# Patient Record
Sex: Male | Born: 2000 | Race: White | Hispanic: No | Marital: Single | Smoking: Former smoker
Health system: Southern US, Community
[De-identification: ages and names within clinical notes are randomized; demographics above are authoritative.]

## PROBLEM LIST (undated history)

## (undated) DIAGNOSIS — J302 Other seasonal allergic rhinitis: Secondary | ICD-10-CM

## (undated) DIAGNOSIS — J45909 Unspecified asthma, uncomplicated: Secondary | ICD-10-CM

## (undated) DIAGNOSIS — K635 Polyp of colon: Secondary | ICD-10-CM

## (undated) DIAGNOSIS — F909 Attention-deficit hyperactivity disorder, unspecified type: Secondary | ICD-10-CM

## (undated) DIAGNOSIS — F419 Anxiety disorder, unspecified: Secondary | ICD-10-CM

## (undated) HISTORY — DX: Polyp of colon: K63.5

## (undated) HISTORY — PX: COLONOSCOPY: SHX174

---
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
## 2018-05-26 DIAGNOSIS — J301 Allergic rhinitis due to pollen: Secondary | ICD-10-CM | POA: Diagnosis not present

## 2018-05-26 DIAGNOSIS — J3081 Allergic rhinitis due to animal (cat) (dog) hair and dander: Secondary | ICD-10-CM | POA: Diagnosis not present

## 2018-05-26 DIAGNOSIS — J3089 Other allergic rhinitis: Secondary | ICD-10-CM | POA: Diagnosis not present

## 2018-05-26 DIAGNOSIS — J4540 Moderate persistent asthma, uncomplicated: Secondary | ICD-10-CM | POA: Diagnosis not present

## 2018-06-10 ENCOUNTER — Telehealth: Payer: Self-pay

## 2018-06-10 ENCOUNTER — Other Ambulatory Visit: Payer: Self-pay

## 2018-06-10 DIAGNOSIS — R251 Tremor, unspecified: Secondary | ICD-10-CM

## 2018-06-10 NOTE — Telephone Encounter (Signed)
Copied from [place] [phone]. Topic: Referral - Request for Referral >> Jun 09, 2018  1:16 PM Marin Olp L wrote: Has patient seen PCP for this complaint? No. (unsure) *If NO, is insurance requiring patient see PCP for this issue before PCP can refer them? yes Referral for which specialty: Neurology Preferred provider/office: whoever Dr. Jerline Pain recommends Reason for referral: tremors (runs in the family, allergist suggested referral to neuro)

## 2018-06-10 NOTE — Telephone Encounter (Signed)
[facility] with referral.  Algis Greenhouse. Jerline Pain, MD 06/10/2018 11:19 AM

## 2018-06-10 NOTE — Telephone Encounter (Signed)
Please advise.  Do you want patient to come in for OV first?

## 2018-06-10 NOTE — Telephone Encounter (Signed)
Referral has been placed. 

## 2018-06-12 ENCOUNTER — Encounter: Payer: Self-pay | Admitting: Neurology

## 2018-06-23 NOTE — Progress Notes (Addendum)
Subjective:   Kevin Zimmerman was seen in consultation in the movement disorder clinic at the request of Vivi Barrack, MD.  The patient is accompanied by his mother who supplements the history.  The records that were made available to me were reviewed but no notes are seen regarding tremor. The evaluation is for tremor.  Tremor is located in the arms, legs, and head.  He is right-hand dominant, but tremor seems to be the same in both hands.  His maternal grandfather had tremor and several maternal cousins.    Affected by caffeine:  Yes.   Affected by alcohol: Does not drink enough to know. Affected by stress:  Yes.   Spills soup if on spoon:  Yes.   Spills glass of liquid if full:  Yes.   Affects ADL's (tying shoes, brushing teeth, etc):  No.  Current/Previously tried tremor medications: None  Current medications that may exacerbate tremor:  albuterol (uses this 1 time per week), concerta (currently, does not use this.  Only uses this when at school and is currently taking a gap year), advair (uses this daily)  Outside reports reviewed: historical medical records, lab reports and referral letter/letters.  Allergies  Allergen Reactions  . Food Anaphylaxis    Tested positive to peanuts years ago but tolerates peanuts well at present. 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  . 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 Penicillins Anaphylaxis    Outpatient Encounter Medications as of 06/25/2018  Medication Sig  . fluticasone-salmeterol (ADVAIR HFA) 115-21 MCG/ACT inhaler Inhale 2 puffs into the lungs every morning.  Marland Kitchen ibuprofen (ADVIL,MOTRIN) 200 MG tablet Take 200 mg by mouth every 6 (six) hours as needed for pain. Reported on 08/29/2015  . albuterol (PROVENTIL HFA;VENTOLIN HFA) 108 (90 Base) MCG/ACT inhaler Inhale 2 puffs into the lungs every 6 (six) hours as needed for wheezing.  (Patient not taking: Reported on 06/25/2018)  . EPINEPHrine 0.3 mg/0.3 mL IJ SOAJ injection Inject 0.3 mLs (0.3 mg total) into the muscle once. (Patient not taking: Reported on 06/25/2018)  . [DISCONTINUED] methylphenidate (CONCERTA) 36 MG PO CR tablet 2 caps every morning   No facility-administered encounter medications on file as of 06/25/2018.     Past Medical History:  Diagnosis Date  . ADHD (attention deficit hyperactivity disorder)   . Asthma   . Colon polyps   . Seasonal allergies     Past Surgical History:  Procedure Laterality Date  . COLONOSCOPY    . COLONOSCOPY N/A 10/09/2012   Procedure: COLONOSCOPY;  Surgeon: Oletha Blend, MD;  Location: [place];  Service: Gastroenterology;  Laterality: N/A;    Social History   Socioeconomic History  . Marital status: Single    Spouse name: Not on file  . Number of children: Not on file  . Years of education: Not on file  . Highest education level: Not on file  Occupational History  . Not on file  Social Needs  . Financial resource strain: Not on file  . Food insecurity:    Worry: Not on file    Inability: Not on file  . Transportation needs:    Medical: Not on file    Non-medical: Not on file  Tobacco Use  . Smoking status: Former Smoker    Packs/day: 0.50    Types: Cigarettes    Last attempt to quit: 03/21/2017    Years since quitting: 1.2  .  Smokeless tobacco: Never Used  Substance and Sexual Activity  . Alcohol use: Yes    Alcohol/week: 1.0 standard drinks    Types: 1 Cans of beer per week  . Drug use: No  . Sexual activity: Yes    Partners: Female    Birth control/protection: Condom    Comment: One partner. Uncertain of additional contraception besides condoms.  Lifestyle  . Physical activity:    Days per week: Not on file    Minutes per session: Not on file  . Stress: Not on file  Relationships  . Social connections:    Talks on phone: Not on file    Gets together: Not on file    Attends religious  service: Not on file    Active member of club or organization: Not on file    Attends meetings of clubs or organizations: Not on file    Relationship status: Not on file  . Intimate partner violence:    Fear of current or ex partner: Not on file    Emotionally abused: Not on file    Physically abused: Not on file    Forced sexual activity: Not on file  Other Topics Concern  . Not on file  Social History Narrative   Senior in high school    Family Status  Relation Name Status  . Sister 92 yo Comptroller  . Brother 33 yo Comptroller  . PGF  (Not Specified)  . Mat Uncle  (Not Specified)  . Annamarie Major  (Not Specified)  . MGM  (Not Specified)  . MGF  (Not Specified)  . Mother  Alive  . Father  Alive    Review of Systems Review of Systems  Constitutional: Negative.   HENT: Negative.   Eyes: Negative.   Respiratory: Positive for shortness of breath (With asthma).   Cardiovascular: Negative.   Gastrointestinal: Negative.   Genitourinary: Negative.   Musculoskeletal: Negative.   Skin: Negative.   Endo/Heme/Allergies: Negative.      Objective:   VITALS:   Vitals:   06/25/18 0908  BP: 118/76  Pulse: 68  SpO2: 98%  Weight: 159 lb (72.1 kg)  Height: 5\' 7"  (1.702 m)   Gen:  Appears stated age and in NAD. HEENT:  Normocephalic, atraumatic. The mucous membranes are moist. The superficial temporal arteries are without ropiness or tenderness. Cardiovascular: Regular rate and rhythm. Lungs: Clear to auscultation bilaterally. Neck: There are no carotid bruits noted bilaterally.  NEUROLOGICAL:  Orientation:  The patient is alert and oriented x 3.  Recent and remote memory are intact.  Attention span and concentration are normal.  Able to name objects and repeat without trouble.  Fund of knowledge is appropriate Cranial nerves: There is good facial symmetry. The pupils are equal round and reactive to light bilaterally. Fundoscopic exam reveals clear disc margins bilaterally. Extraocular  muscles are intact and visual fields are full to confrontational testing. Speech is fluent and clear. Doesn't allow for examination of the oropharynx/soft palate (wearing a mask and does not want to take it off).  Hearing is intact to conversational tone. Tone: Tone is good throughout. Sensation: Sensation is intact to light touch and pinprick throughout (facial, trunk, extremities). Vibration is intact at the bilateral big toe. There is no extinction with double simultaneous stimulation. There is no sensory dermatomal level identified. Coordination:  The patient has no dysdiadichokinesia or dysmetria. Motor: Strength is 5/5 in the bilateral upper and lower extremities.  Shoulder shrug is equal bilaterally.  There is  no pronator drift.  There are no fasciculations noted. DTR's: Deep tendon reflexes are 2/4 at the bilateral biceps, triceps, brachioradialis, patella and achilles.  Plantar responses are downgoing bilaterally. Gait and Station: The patient is able to ambulate without difficulty. The patient is able to heel toe walk without any difficulty. The patient is able to ambulate in a tandem fashion. The patient is able to stand in the Romberg position.   MOVEMENT EXAM: Tremor:  There is no rest tremor.  There is no leg tremor.  There is no head tremor.  He has very mild tremor of the outstretched hands.  There is almost no tremor when he is given a weight.  He has almost no trouble with Archimedes spirals.  He has some tremor when pouring water from one glass to another, but does not spill much of the water.    When asked to tap out a rhythm with one hand, the frequency of the tremor in the other hand matches the rhythm asked to be tapped out.   Lab Results  Component Value Date   TSH 1.07 03/18/2017     Chemistry      Component Value Date/Time   NA 137 03/19/2017 0235   K 3.6 03/19/2017 0235   CL 102 03/19/2017 0235   CO2 24 03/19/2017 0235   BUN 9 03/19/2017 0235   CREATININE 0.87  03/19/2017 0235      Component Value Date/Time   CALCIUM 9.6 03/19/2017 0235   ALKPHOS 102 03/18/2017 1550   AST 23 03/18/2017 1550   ALT 30 03/18/2017 1550   BILITOT 0.5 03/18/2017 1550             Assessment/Plan:   1.  Tremor.  -This is likely essential tremor, aggravated by medications for asthma.  He does have somewhat of a nonphysiologic component at times.   We discussed nature and pathophysiology.  We discussed that this can continue to gradually get worse with time.  We discussed that some medications can worsen this, as can caffeine use.  We discussed medication therapy as well nonpharmacologic methods of treatment (biofeedback).  The patient does not want medication.  The patient asked about CBD.  Explained that there are no good controlled studies on marijuana or CBD for tremor and the American Academy of neurology does not recommend this.    -will do tsh and chem  -His neurologic exam is nonfocal and nonlateralizing today.  -We will call him with his lab work.  -Patient will let me know if he changes his mind regarding medication, or if he develops new or worsening signs or symptoms.  Otherwise, he will follow-up with me on an as-needed basis.  2.  Tobacco abuse  -discussed importance of cessation for overall health/wellness, especially given asthma  CC:  Vivi Barrack, MD

## 2018-06-25 ENCOUNTER — Other Ambulatory Visit (INDEPENDENT_AMBULATORY_CARE_PROVIDER_SITE_OTHER): Payer: BLUE CROSS/BLUE SHIELD

## 2018-06-25 ENCOUNTER — Encounter: Payer: Self-pay | Admitting: Neurology

## 2018-06-25 ENCOUNTER — Other Ambulatory Visit: Payer: Self-pay

## 2018-06-25 ENCOUNTER — Ambulatory Visit (INDEPENDENT_AMBULATORY_CARE_PROVIDER_SITE_OTHER): Payer: BLUE CROSS/BLUE SHIELD | Admitting: Neurology

## 2018-06-25 VITALS — BP 118/76 | HR 68 | Ht 67.0 in | Wt 159.0 lb

## 2018-06-25 DIAGNOSIS — R251 Tremor, unspecified: Secondary | ICD-10-CM

## 2018-06-25 DIAGNOSIS — J45909 Unspecified asthma, uncomplicated: Secondary | ICD-10-CM | POA: Diagnosis not present

## 2018-06-25 DIAGNOSIS — Z720 Tobacco use: Secondary | ICD-10-CM | POA: Diagnosis not present

## 2018-06-26 ENCOUNTER — Telehealth: Payer: Self-pay

## 2018-06-26 ENCOUNTER — Ambulatory Visit: Payer: BLUE CROSS/BLUE SHIELD | Admitting: Internal Medicine

## 2018-06-26 ENCOUNTER — Telehealth: Payer: Self-pay | Admitting: *Deleted

## 2018-06-26 ENCOUNTER — Encounter: Payer: Self-pay | Admitting: Internal Medicine

## 2018-06-26 VITALS — BP 102/70 | HR 87 | Temp 98.5°F | Wt 158.4 lb

## 2018-06-26 DIAGNOSIS — B9789 Other viral agents as the cause of diseases classified elsewhere: Secondary | ICD-10-CM | POA: Diagnosis not present

## 2018-06-26 DIAGNOSIS — J069 Acute upper respiratory infection, unspecified: Secondary | ICD-10-CM

## 2018-06-26 DIAGNOSIS — J4520 Mild intermittent asthma, uncomplicated: Secondary | ICD-10-CM

## 2018-06-26 LAB — COMPREHENSIVE METABOLIC PANEL
AG Ratio: 2.1 (calc) (ref 1.0–2.5)
ALBUMIN MSPROF: 5.2 g/dL — AB (ref 3.6–5.1)
ALT: 25 U/L (ref 8–46)
AST: 18 U/L (ref 12–32)
Alkaline phosphatase (APISO): 92 U/L (ref 46–169)
BILIRUBIN TOTAL: 0.6 mg/dL (ref 0.2–1.1)
BUN: 15 mg/dL (ref 7–20)
CALCIUM: 10 mg/dL (ref 8.9–10.4)
CO2: 27 mmol/L (ref 20–32)
Chloride: 104 mmol/L (ref 98–110)
Creat: 0.86 mg/dL (ref 0.60–1.26)
GLOBULIN: 2.5 g/dL (ref 2.1–3.5)
Glucose, Bld: 87 mg/dL (ref 65–99)
POTASSIUM: 4.9 mmol/L (ref 3.8–5.1)
SODIUM: 140 mmol/L (ref 135–146)
TOTAL PROTEIN: 7.7 g/dL (ref 6.3–8.2)

## 2018-06-26 LAB — TSH: TSH: 0.71 m[IU]/L (ref 0.50–4.30)

## 2018-06-26 NOTE — Patient Instructions (Signed)
-I hope you feel better soon!  -May use mucinex twice a day, delsym twice a day as needed for cough and over the counter flu/sinues medication as needed for pain and congestion.  -Come back to see [place] if no improvement in 10-14 days.   Viral Respiratory Infection A viral respiratory infection is an illness that affects parts of the body that are used for breathing. These include the lungs, nose, and throat. It is caused by a germ called a virus. Some examples of this kind of infection are:  A cold.  The flu (influenza).  A respiratory syncytial virus (RSV) infection. A person who gets this illness may have the following symptoms:  A stuffy or runny nose.  Yellow or green fluid in the nose.  A cough.  Sneezing.  Tiredness (fatigue).  Achy muscles.  A sore throat.  Sweating or chills.  A fever.  A headache. Follow these instructions at home: Managing pain and congestion  Take over-the-counter and prescription medicines only as told by your doctor.  If you have a sore throat, gargle with salt water. Do this 3-4 times per day or as needed. To make a salt-water mixture, dissolve -1 tsp of salt in 1 cup of warm water. Make sure that all the salt dissolves.  Use nose drops made from salt water. This helps with stuffiness (congestion). It also helps soften the skin around your nose.  Drink enough fluid to keep your pee (urine) pale yellow. General instructions   Rest as much as possible.  Do not drink alcohol.  Do not use any products that have nicotine or tobacco, such as cigarettes and e-cigarettes. If you need help quitting, ask your doctor.  Keep all follow-up visits as told by your doctor. This is important. How is this prevented?   Get a flu shot every year. Ask your doctor when you should get your flu shot.  Do not let other people get your germs. If you are sick: ? Stay home from work or school. ? Wash your hands with soap and water often. Wash your  hands after you cough or sneeze. If soap and water are not available, use hand sanitizer.  Avoid contact with people who are sick during cold and flu season. This is in fall and winter. Get help if:  Your symptoms last for 10 days or longer.  Your symptoms get worse over time.  You have a fever.  You have very bad pain in your face or forehead.  Parts of your jaw or neck become very swollen. Get help right away if:  You feel pain or pressure in your chest.  You have shortness of breath.  You faint or feel like you will faint.  You keep throwing up (vomiting).  You feel confused. Summary  A viral respiratory infection is an illness that affects parts of the body that are used for breathing.  Examples of this illness include a cold, the flu, and respiratory syncytial virus (RSV) infection.  The infection can cause a runny nose, cough, sneezing, sore throat, and fever.  Follow what your doctor tells you about taking medicines, drinking lots of fluid, washing your hands, resting at home, and avoiding people who are sick. This information is not intended to replace advice given to you by your health care provider. Make sure you discuss any questions you have with your health care provider. Document Released: 03/14/2008 Document Revised: 05/12/2017 Document Reviewed: 05/12/2017 Elsevier Interactive Patient Education  2019 [employer].

## 2018-06-26 NOTE — Telephone Encounter (Signed)
I called the pt to ask prescreening questions due to the Coronavirus.  Patient's mother stated the pt has not had a fever, he has not traveled to areas affected; however his father was in [place] last week-returned 2 days ago and denies any symptoms.  She also stated he has not had any contact with anyone he is aware of that has the virus and he does have a history of pneumonia she feels each time he gets sick.  Message sent to Dr Jerilee Hoh as the pt has an appt today at 11:30am.

## 2018-06-26 NOTE — Telephone Encounter (Signed)
-----   Message from [place], DO sent at 06/26/2018  7:31 AM EDT ----- I have reviewed all lab results which are normal or stable. Please inform the patient.

## 2018-06-26 NOTE — Progress Notes (Signed)
Established Patient Office Visit     CC/Reason for Visit: Cough, wheezing  HPI: Kevin Zimmerman is a 18 y.o. male who is coming in today for the above mentioned reasons. Past Medical History is significant for: Asthma.  He has had a cough productive of minimal amounts of yellow sputum, nasal drainage x3 days, no fever chills, he has been wheezing, states that he is prone to getting pneumonia and wants to get checked out before this happens.  He has had no recent travels or sick contact.  He has not been using his albuterol inhaler more frequently than usual, states his average uses this once to twice a week.  He continues to use his Advair as prescribed.   Past Medical/Surgical History: Past Medical History:  Diagnosis Date  . ADHD (attention deficit hyperactivity disorder)   . Asthma   . Colon polyps   . Seasonal allergies     Past Surgical History:  Procedure Laterality Date  . COLONOSCOPY    . COLONOSCOPY N/A 10/09/2012   Procedure: COLONOSCOPY;  Surgeon: Oletha Blend, MD;  Location: [facility];  Service: Gastroenterology;  Laterality: N/A;    Social History:  reports that he has been smoking cigarettes. He has never used smokeless tobacco. He reports current alcohol use of about 1.0 standard drinks of alcohol per week. He reports current drug use. Drug: Marijuana.  Allergies: Allergies  Allergen Reactions  . Food Anaphylaxis    Tested positive to peanuts years ago but tolerates peanuts well at present. 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  . 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 Penicillins Anaphylaxis    Family History:  Family History  Problem Relation Age of Onset  . Colon polyps Paternal Grandfather   . Cancer Paternal Grandfather   . Asthma Maternal Uncle   . Depression Maternal Uncle   . Early death Paternal Uncle   . Heart disease Paternal Uncle    . Hypertension Paternal Uncle   . Heart attack Paternal Uncle        age 52  . Cancer Maternal Grandmother   . Depression Maternal Grandfather      Current Outpatient Medications:  .  albuterol (PROVENTIL HFA;VENTOLIN HFA) 108 (90 Base) MCG/ACT inhaler, Inhale 2 puffs into the lungs every 6 (six) hours as needed for wheezing., Disp: 1 Inhaler, Rfl: 1 .  EPINEPHrine 0.3 mg/0.3 mL IJ SOAJ injection, Inject 0.3 mLs (0.3 mg total) into the muscle once., Disp: 1 Device, Rfl: 2 .  fluticasone-salmeterol (ADVAIR HFA) 115-21 MCG/ACT inhaler, Inhale 2 puffs into the lungs every morning., Disp: 1 Inhaler, Rfl: 1 .  ibuprofen (ADVIL,MOTRIN) 200 MG tablet, Take 200 mg by mouth every 6 (six) hours as needed for pain. Reported on 08/29/2015, Disp: , Rfl:   Review of Systems:  Constitutional: Denies fever, chills, diaphoresis, appetite change and fatigue.  HEENT: Denies photophobia, eye pain, redness, hearing loss,  mouth sores, trouble swallowing, neck pain, neck stiffness and tinnitus.   Respiratory: Denies SOB, DOE,  chest tightness,  and wheezing.   Cardiovascular: Denies chest pain, palpitations and leg swelling.  Gastrointestinal: Denies nausea, vomiting, abdominal pain, diarrhea, constipation, blood in stool and abdominal distention.  Genitourinary: Denies dysuria, urgency, frequency, hematuria, flank pain and difficulty urinating.  Endocrine: Denies: hot or cold intolerance, sweats, changes in hair or nails, polyuria, polydipsia. Musculoskeletal: Denies myalgias, back pain, joint swelling, arthralgias and gait problem.  Skin: Denies pallor, rash and wound.  Neurological: Denies dizziness, seizures, syncope, weakness, light-headedness, numbness and headaches.  Hematological: Denies adenopathy. Easy bruising, personal or family bleeding history  Psychiatric/Behavioral: Denies suicidal ideation, mood changes, confusion, nervousness, sleep disturbance and agitation    Physical Exam: Vitals:    06/26/18 1128  BP: 102/70  Pulse: 87  Temp: 98.5 F (36.9 C)  TempSrc: Oral  SpO2: 96%  Weight: 158 lb 6.4 oz (71.8 kg)    Body mass index is 24.81 kg/m.   Constitutional: NAD, calm, comfortable Eyes: PERRL, lids and conjunctivae normal ENMT: Mucous membranes are moist. Posterior pharynx is erythematous but clear of any exudate or lesions. Normal dentition. Tympanic membrane is pearly white, no erythema or bulging. Neck: normal, supple, no masses, no thyromegaly, no palpable lymphadenopathy Respiratory: clear to auscultation bilaterally, no wheezing, no crackles. Normal respiratory effort. No accessory muscle use.  Cardiovascular: Regular rate and rhythm, no murmurs / rubs / gallops. No extremity edema. 2+ pedal pulses. No carotid bruits.    Impression and Plan:  Viral URI with cough Mild intermittent asthma, unspecified whether complicated  -Given exam findings, PNA, pharyngitis, ear infection are not likely, hence abx have not been prescribed. -Have advised rest, fluids, OTC antihistamines, cough suppressants and mucinex. -RTC if no improvement in 10-14 days.     Patient Instructions  -I hope you feel better soon!  -May use mucinex twice a day, delsym twice a day as needed for cough and over the counter flu/sinues medication as needed for pain and congestion.  -Come back to see [place] if no improvement in 10-14 days.   Viral Respiratory Infection A viral respiratory infection is an illness that affects parts of the body that are used for breathing. These include the lungs, nose, and throat. It is caused by a germ called a virus. Some examples of this kind of infection are:  A cold.  The flu (influenza).  A respiratory syncytial virus (RSV) infection. A person who gets this illness may have the following symptoms:  A stuffy or runny nose.  Yellow or green fluid in the nose.  A cough.  Sneezing.  Tiredness (fatigue).  Achy muscles.  A sore throat.   Sweating or chills.  A fever.  A headache. Follow these instructions at home: Managing pain and congestion  Take over-the-counter and prescription medicines only as told by your doctor.  If you have a sore throat, gargle with salt water. Do this 3-4 times per day or as needed. To make a salt-water mixture, dissolve -1 tsp of salt in 1 cup of warm water. Make sure that all the salt dissolves.  Use nose drops made from salt water. This helps with stuffiness (congestion). It also helps soften the skin around your nose.  Drink enough fluid to keep your pee (urine) pale yellow. General instructions   Rest as much as possible.  Do not drink alcohol.  Do not use any products that have nicotine or tobacco, such as cigarettes and e-cigarettes. If you need help quitting, ask your doctor.  Keep all follow-up visits as told by your doctor. This is important. How is this prevented?   Get a flu shot every year. Ask your doctor when you should get your flu shot.  Do not let other people get your germs. If you are sick: ? Stay home from work or school. ? Wash your hands with soap and water often. Wash your hands after you cough or sneeze. If soap and water are  not available, use hand sanitizer.  Avoid contact with people who are sick during cold and flu season. This is in fall and winter. Get help if:  Your symptoms last for 10 days or longer.  Your symptoms get worse over time.  You have a fever.  You have very bad pain in your face or forehead.  Parts of your jaw or neck become very swollen. Get help right away if:  You feel pain or pressure in your chest.  You have shortness of breath.  You faint or feel like you will faint.  You keep throwing up (vomiting).  You feel confused. Summary  A viral respiratory infection is an illness that affects parts of the body that are used for breathing.  Examples of this illness include a cold, the flu, and respiratory syncytial  virus (RSV) infection.  The infection can cause a runny nose, cough, sneezing, sore throat, and fever.  Follow what your doctor tells you about taking medicines, drinking lots of fluid, washing your hands, resting at home, and avoiding people who are sick. This information is not intended to replace advice given to you by your health care provider. Make sure you discuss any questions you have with your health care provider. Document Released: 03/14/2008 Document Revised: 05/12/2017 Document Reviewed: 05/12/2017 Elsevier Interactive Patient Education  2019 [place], [place] [facility]

## 2018-06-26 NOTE — Telephone Encounter (Signed)
LMOM with pt's mother, Anderson Malta, relaying results below.

## 2018-07-09 ENCOUNTER — Ambulatory Visit: Payer: BLUE CROSS/BLUE SHIELD | Admitting: Psychologist

## 2018-09-23 DIAGNOSIS — J301 Allergic rhinitis due to pollen: Secondary | ICD-10-CM | POA: Diagnosis not present

## 2018-09-23 DIAGNOSIS — J3081 Allergic rhinitis due to animal (cat) (dog) hair and dander: Secondary | ICD-10-CM | POA: Diagnosis not present

## 2018-09-23 DIAGNOSIS — J3089 Other allergic rhinitis: Secondary | ICD-10-CM | POA: Diagnosis not present

## 2018-09-23 DIAGNOSIS — J4540 Moderate persistent asthma, uncomplicated: Secondary | ICD-10-CM | POA: Diagnosis not present

## 2018-09-29 ENCOUNTER — Ambulatory Visit (INDEPENDENT_AMBULATORY_CARE_PROVIDER_SITE_OTHER): Payer: BC Managed Care – PPO | Admitting: Family

## 2018-09-29 ENCOUNTER — Encounter: Payer: Self-pay | Admitting: Family

## 2018-09-29 DIAGNOSIS — F819 Developmental disorder of scholastic skills, unspecified: Secondary | ICD-10-CM

## 2018-09-29 DIAGNOSIS — Z79899 Other long term (current) drug therapy: Secondary | ICD-10-CM

## 2018-09-29 DIAGNOSIS — F902 Attention-deficit hyperactivity disorder, combined type: Secondary | ICD-10-CM

## 2018-09-29 DIAGNOSIS — R000 Tachycardia, unspecified: Secondary | ICD-10-CM

## 2018-09-29 DIAGNOSIS — Z719 Counseling, unspecified: Secondary | ICD-10-CM

## 2018-09-29 DIAGNOSIS — R278 Other lack of coordination: Secondary | ICD-10-CM

## 2018-09-29 DIAGNOSIS — F419 Anxiety disorder, unspecified: Secondary | ICD-10-CM

## 2018-09-29 NOTE — Progress Notes (Signed)
[facility]. [address] [place] [address] Dept: [phone] Dept Fax: [phone]  Medication Check visit via Virtual Video due to COVID-19  Patient ID:  Kevin Zimmerman  male DOB: 28-Apr-2000   18 y.o.   MRN: [id number]   DATE:09/29/18  PCP: Vivi Barrack, MD  Virtual Visit via Video Note  I connected with  Kevin Zimmerman on 09/29/18 at  3:00 PM EDT by a video enabled telemedicine application and verified that I am speaking with the correct person using two identifiers. Patient Location: at home   I discussed the limitations, risks, security and privacy concerns of performing an evaluation and management service by telephone and the availability of in person appointments. I also discussed with the parents that there may be a patient responsible charge related to this service. The parents expressed understanding and agreed to proceed.  Provider: Carolann Littler, NP  Location: private location  HISTORY/CURRENT STATUS: Kevin Zimmerman is here for medication management of the psychoactive medications for ADHD and review of educational and behavioral concerns.   Kevin Zimmerman currently not taking, which is working well. Takes medication at . Kevin Zimmerman is able to focus through homework.   Kevin Zimmerman is eating well (eating breakfast, lunch and dinner). No reported changes  Sleeping well (getting good amount of sleep), sleeping through the night.   EDUCATION: School: [employer] Year in [place] and spent 10 months in 2 cities.  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   Activities/ Exercise: daily-outside activities  Screen time: (phone, tablet, TV, computer): phone, computer, and movies  MEDICAL HISTORY: Individual Medical  History/ Review of Systems: Changes? :None recently. Does have medication for eczema.   Family Medical/ Social History: Changes? No Patient Lives with: parents  Current Medications:  Current Outpatient Medications on File Prior to Visit  Medication Sig Dispense Refill  . albuterol (PROVENTIL HFA;VENTOLIN HFA) 108 (90 Base) MCG/ACT inhaler Inhale 2 puffs into the lungs every 6 (six) hours as needed for wheezing. 1 Inhaler 1  . EPINEPHrine 0.3 mg/0.3 mL IJ SOAJ injection Inject 0.3 mLs (0.3 mg total) into the muscle once. 1 Device 2  . fluticasone-salmeterol (ADVAIR HFA) 115-21 MCG/ACT inhaler Inhale 2 puffs into the lungs every morning. 1 Inhaler 1  . ibuprofen (ADVIL,MOTRIN) 200 MG tablet Take 200 mg by mouth every 6 (six) hours as needed for pain. Reported on 08/29/2015    . triamcinolone cream (KENALOG) 0.1 % APP EXT ON THE SKIN BID PRF ECZEMA     No current facility-administered medications on file prior to visit.    Medication Side Effects: None  MENTAL HEALTH: Mental Health Issues:   None reported    DIAGNOSES:    ICD-10-CM   1. ADHD (attention deficit hyperactivity disorder), combined type  F90.2   2. Learning disability  F81.9   3. Anxiety  F41.9   4. Sinus tachycardia  R00.0   5. Dysgraphia  R27.8   6. Medication management  Z79.899   7. Patient counseled  Z71.9     RECOMMENDATIONS:  Discussed recent history with patient with health and learning since last f/u visit.   Discussed academic progress and recommended continued academic success in the fall, possible using appropriate accommodations if needed.   Discussed continued need for  routine, structure, and motivation with school and health with COVID-19.  Encouraged recommended limitations on TV, tablets, phones, video games and computers for non-educational activities.   Discussed need for bedtime routine, use of good sleep hygiene, no video games, TV or phones for an hour before bedtime.   Encouraged physical  activity and outdoor activities, maintaining social distancing.   Counseled medication pharmacokinetics, options, dosage, administration, desired effects, and possible side effects.   Stopped your Concerta for the summer, and will restart at a lower dose in the fall. Patient wanting to decreased to 27 mg dose for the fall.   I discussed the assessment and treatment plan with the patient. The patient was provided an opportunity to ask questions and all were answered. The patient agreed with the plan and demonstrated an understanding of the instructions.   I provided 35 minutes of non-face-to-face time during this encounter. Completed record review for 10 minutes prior to the virtual video visit.   NEXT APPOINTMENT:  Return in about 3 months (around 12/30/2018) for follow up visit.  The patient was advised to call back or seek an in-person evaluation if the symptoms worsen or if the condition fails to improve as anticipated.  Medical Decision-making: More than 50% of the appointment was spent counseling and discussing diagnosis and management of symptoms with the patient and family.  Carolann Littler, NP

## 2018-11-06 ENCOUNTER — Other Ambulatory Visit: Payer: Self-pay

## 2018-11-06 MED ORDER — METHYLPHENIDATE HCL ER (OSM) 36 MG PO TBCR: 36.0000 mg | EXTENDED_RELEASE_TABLET | Freq: Every day | ORAL | 0 refills | Status: DC

## 2018-11-06 NOTE — Telephone Encounter (Signed)
Concerta 36 mg daily, # 30 with no RF"s RX for above e-scribed and sent to pharmacy on record   [facility], [place] [address] AT [address] [place] [address] Phone: [phone] Fax: [phone]

## 2018-11-06 NOTE — Telephone Encounter (Signed)
Mom called in for refill for Concerta 36mg . Last visit 09/29/2018 next visit 01/11/2019. Please escribe to [facility] on [address]

## 2018-11-09 ENCOUNTER — Encounter: Payer: Self-pay | Admitting: Family Medicine

## 2018-11-09 ENCOUNTER — Ambulatory Visit (INDEPENDENT_AMBULATORY_CARE_PROVIDER_SITE_OTHER): Payer: BC Managed Care – PPO | Admitting: Family Medicine

## 2018-11-09 DIAGNOSIS — R059 Cough, unspecified: Secondary | ICD-10-CM

## 2018-11-09 DIAGNOSIS — R05 Cough: Secondary | ICD-10-CM

## 2018-11-09 DIAGNOSIS — R509 Fever, unspecified: Secondary | ICD-10-CM

## 2018-11-09 DIAGNOSIS — J4520 Mild intermittent asthma, uncomplicated: Secondary | ICD-10-CM | POA: Diagnosis not present

## 2018-11-09 DIAGNOSIS — Z20828 Contact with and (suspected) exposure to other viral communicable diseases: Secondary | ICD-10-CM

## 2018-11-09 MED ORDER — AZITHROMYCIN 250 MG PO TABS: ORAL_TABLET | ORAL | 0 refills | Status: DC
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
## 2018-11-10 ENCOUNTER — Other Ambulatory Visit: Payer: Self-pay

## 2018-11-10 DIAGNOSIS — Z20822 Contact with and (suspected) exposure to covid-19: Secondary | ICD-10-CM

## 2018-11-10 DIAGNOSIS — R6889 Other general symptoms and signs: Secondary | ICD-10-CM | POA: Diagnosis not present

## 2018-11-12 LAB — NOVEL CORONAVIRUS, NAA: SARS-CoV-2, NAA: DETECTED — AB

## 2018-11-12 NOTE — Progress Notes (Signed)
Please call patient: I have reviewed his/her lab results. covid testing is positive. Follow supportive care instructions we discussed in office visit, monitor symptoms, and follow up if worsening.  Home isolation x 14 days from symptom onset.

## 2018-11-23 ENCOUNTER — Other Ambulatory Visit: Payer: Self-pay

## 2018-11-23 NOTE — Telephone Encounter (Addendum)
Patient called in for refill for Concerta 27mg . Last visit 09/29/2018 next visit 01/11/2019. [facility] escribe to [facility] on [address]

## 2018-11-23 NOTE — Telephone Encounter (Deleted)
E-Prescribed Concerta 27 mg directly to  [facility], [place] [address] [place] [address] Phone: [phone] Fax: [phone]

## 2018-11-24 ENCOUNTER — Encounter: Payer: Self-pay | Admitting: Psychologist

## 2018-11-24 ENCOUNTER — Ambulatory Visit (INDEPENDENT_AMBULATORY_CARE_PROVIDER_SITE_OTHER): Payer: BC Managed Care – PPO | Admitting: Psychologist

## 2018-11-24 DIAGNOSIS — F4322 Adjustment disorder with anxiety: Secondary | ICD-10-CM | POA: Diagnosis not present

## 2018-11-24 DIAGNOSIS — F902 Attention-deficit hyperactivity disorder, combined type: Secondary | ICD-10-CM | POA: Diagnosis not present

## 2018-11-24 MED ORDER — METHYLPHENIDATE HCL ER (OSM) 27 MG PO TBCR
27.0000 mg | EXTENDED_RELEASE_TABLET | ORAL | 0 refills | Status: DC
Start: 2018-11-24 — End: 2019-01-11

## 2018-11-24 NOTE — Telephone Encounter (Signed)
E-Prescribed Concerta 27 mg Q AM directly to  [place], [place] [address] Phone: [phone] Fax: [phone]

## 2018-11-24 NOTE — Progress Notes (Signed)
  [facility] [address], [address] [place] [address] Dept: [phone] Dept Fax: [phone] Loc: [phone] Loc Fax: [phone]  Psychology Therapy Session Progress Note  Patient ID: Kevin Zimmerman, male  DOB: [birth year], 18 y.o.  MRN: [id number]  11/24/2018 Start time: 10 AM End time: 10:50 AM  Session #: Psychotherapy video conference session  Present: patient  Service provided: [id number] Individual Psychotherapy (45 min.)  Current Concerns: Recovering from COVID-19.  Fortunately had a mild case with only several days of flulike symptoms.  Leaving for her freshman year of college at [employer] in 2 days and feeling extremely anxious/nervous.  ADHD with weak and inconsistent metacognition.  Parent teen relationship significantly improved over the summer.  Current Symptoms: Anxiety, Attention problem, Family Stress and Organization problem  Mental Status: Appearance: Well Groomed Attention: good  Motor Behavior: Normal Affect: Full Range Mood: anxious Thought Process: normal Thought Content: normal Suicidal Ideation: None Homicidal Ideation:None Orientation: time, place and person Insight: Fair Judgement: Fair  Diagnosis: Adjustment disorder with anxiety, ADHD  Long Term Treatment Goals:  1) decrease anxiety 2) resist flight/freeze response 3) identify anxiety inducing thoughts 4) use relaxation strategies (deep breathing, visualization, cognitive cueing, muscle relaxation) Photographer 4 handouts on anxiety management  1) decrease impulsivity 2) increase self-monitoring 3) increase organizational skills 4) increase time management skills 5) increased behavioral regulation 6) increase self-monitoring 7) utilized cognitive behavioral principles    Anticipated Frequency of Visits: As needed Anticipated Length  of Treatment Episode: As needed  Treatment Intervention: Cognitive Behavioral therapy  Response to Treatment: Neutral  Medical Necessity: Assisted patient to achieve or maintain maximum functional capacity  Plan: CBT  Virtual Visit via Video Note  I connected with Kevin Zimmerman on 11/24/18 at 10:00 AM EDT by a video enabled telemedicine application and verified that I am speaking with the correct person using two identifiers.  Location: Patient: Home Provider: [facility] office   I discussed the limitations of evaluation and management by telemedicine and the availability of in person appointments. The patient expressed understanding and agreed to proceed.   I discussed the assessment and treatment plan with the patient. The patient was provided an opportunity to ask questions and all were answered. The patient agreed with the plan and demonstrated an understanding of the instructions.   The patient was advised to call back or seek an in-person evaluation if the symptoms worsen or if the condition fails to improve as anticipated.  I provided 50 minutes of non-face-to-face time during this encounter.   Clovis Pu, PhD   R. Eloise Harman 11/24/2018

## 2019-01-11 ENCOUNTER — Encounter: Payer: Self-pay | Admitting: Family

## 2019-01-11 ENCOUNTER — Ambulatory Visit (INDEPENDENT_AMBULATORY_CARE_PROVIDER_SITE_OTHER): Payer: BC Managed Care – PPO | Admitting: Family

## 2019-01-11 DIAGNOSIS — Z719 Counseling, unspecified: Secondary | ICD-10-CM

## 2019-01-11 DIAGNOSIS — F419 Anxiety disorder, unspecified: Secondary | ICD-10-CM

## 2019-01-11 DIAGNOSIS — F819 Developmental disorder of scholastic skills, unspecified: Secondary | ICD-10-CM | POA: Diagnosis not present

## 2019-01-11 DIAGNOSIS — Z79899 Other long term (current) drug therapy: Secondary | ICD-10-CM

## 2019-01-11 DIAGNOSIS — F902 Attention-deficit hyperactivity disorder, combined type: Secondary | ICD-10-CM | POA: Diagnosis not present

## 2019-01-11 DIAGNOSIS — R000 Tachycardia, unspecified: Secondary | ICD-10-CM

## 2019-01-11 DIAGNOSIS — R278 Other lack of coordination: Secondary | ICD-10-CM

## 2019-01-11 MED ORDER — METHYLPHENIDATE HCL ER (OSM) 27 MG PO TBCR: 27.0000 mg | EXTENDED_RELEASE_TABLET | ORAL | 0 refills | Status: DC

## 2019-01-11 NOTE — Progress Notes (Signed)
[facility]. [address] [place] [address] Dept: [phone] Dept Fax: [phone]  Medication Check visit via Virtual Video due to COVID-19  Patient ID:  Kevin Zimmerman  male DOB: 06-19-2000   18 y.o.   MRN: [id number]   DATE:01/11/19  PCP: Vivi Barrack, MD  Virtual Visit via Video Note  I connected with  Kevin Zimmerman on 01/11/19 at  3:00 PM EDT by a video enabled telemedicine application and verified that I am speaking with the correct person using two identifiers. Patient/Parent Location: at school   I discussed the limitations, risks, security and privacy concerns of performing an evaluation and management service by telephone and the availability of in person appointments. I also discussed with the parents that there may be a patient responsible charge related to this service. The parents expressed understanding and agreed to proceed.  Provider: Carolann Littler, NP  Location: private location  HISTORY/CURRENT STATUS: Kevin Zimmerman is here for medication management of the psychoactive medications for ADHD and review of educational and behavioral concerns.   Kevin Zimmerman currently taking Concerta 27 mg daily, which is working well. Takes medication daily at the same time. Medication tends to wear off around evening time. Kevin Zimmerman is able to focus through school/homework.   Kevin Zimmerman is eating well (eating breakfast, lunch and dinner). Eating with no changes reported by patient.   Sleeping well (getting plenty of sleep), sleeping through the night.   EDUCATION: School: [employer] taking 4 classes + lab=13 credit hours this semester Year/Grade: freshman in college  Performance/ Grades: above average Services: Other: help as needed at school  Kevin Zimmerman is currently in distance learning due to social distancing due to COVID-19 and will continue for at least: for this semester.   Activities/ Exercise:  daily being outside and hiking  Screen time: (phone, tablet, TV, computer): computer, TV, phone  MEDICAL HISTORY: Individual Medical History/ Review of Systems: Changes? :Yes recent visit to health center on campus for tendinitis in his leg  Family Medical/ Social History: Changes? No Patient Lives with: 1 roommate at college in dorm room  Current Medications:  Outpatient Encounter Medications as of 01/11/2019  Medication Sig  . albuterol (PROVENTIL HFA;VENTOLIN HFA) 108 (90 Base) MCG/ACT inhaler Inhale 2 puffs into the lungs every 6 (six) hours as needed for wheezing.  Marland Kitchen EPINEPHrine 0.3 mg/0.3 mL IJ SOAJ injection Inject 0.3 mLs (0.3 mg total) into the muscle once.  . fluticasone-salmeterol (ADVAIR HFA) 115-21 MCG/ACT inhaler Inhale 2 puffs into the lungs every morning.  Marland Kitchen ibuprofen (ADVIL,MOTRIN) 200 MG tablet Take 200 mg by mouth every 6 (six) hours as needed for pain. Reported on 08/29/2015  . methylphenidate (CONCERTA) 27 MG PO CR tablet Take 1 tablet (27 mg total) by mouth every morning.  . triamcinolone cream (KENALOG) 0.1 % APP EXT ON THE SKIN BID PRF ECZEMA  . [DISCONTINUED] azithromycin (ZITHROMAX) 250 MG tablet Take 2 tabs today, then 1 tab daily for 4 days (Patient not taking: Reported on 01/11/2019)  . [DISCONTINUED] methylphenidate (CONCERTA) 27 MG PO CR tablet Take 1 tablet (27 mg total) by mouth every morning.   No facility-administered encounter medications on file as of 01/11/2019.    Medication Side Effects: None  MENTAL HEALTH: Mental Health Issues:   Anxiety regarding school but uses techniques from Dr. Bobby Rumpf. Has seen him recently but no regular f/u scheduled, only prn.   DIAGNOSES:    ICD-10-CM  1. ADHD (attention deficit hyperactivity disorder), combined type  F90.2   2. Anxiety  F41.9   3. Dysgraphia  R27.8   4. Learning disability  F81.9   5. Sinus tachycardia  R00.0   6. Medication management  Z79.899   7. Patient counseled  Z71.9     RECOMMENDATIONS:   Discussed recent history with patient for updates with school, learning, campus life, health and medication.   Discussed school academic progress and recommended continued accommodations for the new school year.  Referred to [employer] for resources about using distance learning with children with ADHD for learning support.   Children and young adults with ADHD often suffer from disorganization, difficulty with time management, completing projects and other executive function difficulties.  Recommended Reading: "Smart but Scattered" and "Smart but Scattered Teens" by Peg Renato Battles and Ethelene Browns.    Discussed continued need for structure, routine, reward (external), motivation (internal), positive reinforcement, consequences, and organization with school work and dorm life.  Encouraged recommended limitations on TV, tablets, phones, video games and computers for non-educational activities.   Discussed need for bedtime routine, use of good sleep hygiene, no video games, TV or phones for an hour before bedtime.   Encouraged physical activity and outdoor play, maintaining social distancing.   Counseled medication pharmacokinetics, options, dosage, administration, desired effects, and possible side effects.   Concerta 27 mg daily, # 30 with no RF's. RX for above e-scribed and sent to pharmacy on record  [facility] [id number] [place], [place] - [address] AT [facility] [address] [place] [address] [place] [address] Phone: [phone] Fax: [phone]  I discussed the assessment and treatment plan with the patient. The patient was provided an opportunity to ask questions and all were answered. The patient agreed with the plan and demonstrated an understanding of the instructions.   I provided 25 minutes of non-face-to-face time during this encounter. Completed record review for 10 minutes prior to the virtual video visit.   NEXT APPOINTMENT:  Return in about 3  months (around 04/12/2019) for f/u in office.  The patient was advised to call back or seek an in-person evaluation if the symptoms worsen or if the condition fails to improve as anticipated.  Medical Decision-making: More than 50% of the appointment was spent counseling and discussing diagnosis and management of symptoms with the patient and family.  Carolann Littler, NP

## 2019-03-03 ENCOUNTER — Encounter: Payer: Self-pay | Admitting: Psychologist

## 2019-03-03 ENCOUNTER — Ambulatory Visit (INDEPENDENT_AMBULATORY_CARE_PROVIDER_SITE_OTHER): Payer: BC Managed Care – PPO | Admitting: Psychologist

## 2019-03-03 ENCOUNTER — Other Ambulatory Visit: Payer: Self-pay

## 2019-03-03 DIAGNOSIS — F4322 Adjustment disorder with anxiety: Secondary | ICD-10-CM

## 2019-03-03 DIAGNOSIS — F902 Attention-deficit hyperactivity disorder, combined type: Secondary | ICD-10-CM | POA: Diagnosis not present

## 2019-03-03 NOTE — Progress Notes (Signed)
  [facility] [address] [place] [address] Dept: [phone] Dept Fax: [phone] Loc: [phone] Loc Fax: [phone]  Psychology Therapy Session Progress Note  Patient ID: Kevin Zimmerman, male  DOB: 2000-10-21, 18 y.o.  MRN: [id number]  03/03/2019 Start time: 2 PM End time: 2:50 PM  Session #: Videoconference psychotherapy session  Virtual Visit via Video Note  I connected with Kevin Zimmerman on 03/03/19 at  2:00 PM EST by a video enabled telemedicine application and verified that I am speaking with the correct person using two identifiers.  Location: Patient: [employer] dorm room Provider: [facility] office   I discussed the limitations of evaluation and management by telemedicine and the availability of in person appointments. The patient expressed understanding and agreed to proceed.   I discussed the assessment and treatment plan with the patient. The patient was provided an opportunity to ask questions and all were answered. The patient agreed with the plan and demonstrated an understanding of the instructions.   The patient was advised to call back or seek an in-person evaluation if the symptoms worsen or if the condition fails to improve as anticipated.  I provided 50 minutes of non-face-to-face time during this encounter.   Present: patient  Service provided: [id number] Individual Psychotherapy (45 min.)  Current Concerns: Anxiety secondary to end of first semester at college.  Final papers and exams do.  Struggling with online classes because of COVID-19.  Very socially isolated which is causing considerable stress and frustration.  ADHD with weak and inconsistent executive functioning, particularly metacognition.  Current Symptoms: Anxiety and Organization problem  Mental Status: Appearance: Well Groomed Attention:  good  Motor Behavior: Normal Affect: Full Range Mood: anxious Thought Process: normal Thought Content: normal Suicidal Ideation: None Homicidal Ideation:None Orientation: time, place and person Insight: Fair Judgement: Fair  Diagnosis: Adjustment disorder with anxiety, ADHD  Long Term Treatment Goals:  1) decrease anxiety 2) resist flight/freeze response 3) identify anxiety inducing thoughts 4) use relaxation strategies (deep breathing, visualization, cognitive cueing, muscle relaxation)   1) decrease impulsivity 2) increase self-monitoring 3) increase organizational skills 4) increase time management skills 5) increased behavioral regulation 6) increase self-monitoring 7) utilized cognitive behavioral principles    Anticipated Frequency of Visits: As needed Anticipated Length of Treatment Episode: As needed  Treatment Intervention: Cognitive Behavioral therapy and Supportive therapy  Response to Treatment: Neutral  Medical Necessity: Assisted patient to achieve or maintain maximum functional capacity  Plan: CBT  REloise Harman 03/03/2019

## 2019-03-09 ENCOUNTER — Institutional Professional Consult (permissible substitution): Payer: BC Managed Care – PPO | Admitting: Family

## 2019-03-16 ENCOUNTER — Ambulatory Visit (INDEPENDENT_AMBULATORY_CARE_PROVIDER_SITE_OTHER): Payer: BC Managed Care – PPO | Admitting: Family

## 2019-03-16 ENCOUNTER — Other Ambulatory Visit: Payer: Self-pay

## 2019-03-16 ENCOUNTER — Encounter: Payer: Self-pay | Admitting: Family

## 2019-03-16 DIAGNOSIS — R000 Tachycardia, unspecified: Secondary | ICD-10-CM

## 2019-03-16 DIAGNOSIS — F419 Anxiety disorder, unspecified: Secondary | ICD-10-CM

## 2019-03-16 DIAGNOSIS — F902 Attention-deficit hyperactivity disorder, combined type: Secondary | ICD-10-CM | POA: Diagnosis not present

## 2019-03-16 DIAGNOSIS — R278 Other lack of coordination: Secondary | ICD-10-CM

## 2019-03-16 DIAGNOSIS — F819 Developmental disorder of scholastic skills, unspecified: Secondary | ICD-10-CM

## 2019-03-16 DIAGNOSIS — J4520 Mild intermittent asthma, uncomplicated: Secondary | ICD-10-CM

## 2019-03-16 DIAGNOSIS — Z79899 Other long term (current) drug therapy: Secondary | ICD-10-CM

## 2019-03-16 DIAGNOSIS — Z719 Counseling, unspecified: Secondary | ICD-10-CM

## 2019-03-16 MED ORDER — METHYLPHENIDATE HCL ER (OSM) 27 MG PO TBCR: 27.0000 mg | EXTENDED_RELEASE_TABLET | ORAL | 0 refills | Status: DC

## 2019-03-16 NOTE — Progress Notes (Signed)
[facility]  [place] [address] Dept: [phone] Dept Fax: [phone]  Medication Check visit via Virtual Video due to COVID-19  Patient ID:  Kevin Zimmerman  male DOB: 01-30-2001   18 y.o.   MRN: [id number]   DATE:03/16/19  PCP: Vivi Barrack, MD  Virtual Visit via Video Note  I connected with  Kevin Zimmerman on 03/16/19 at  8:00 AM EST by a video enabled telemedicine application and verified that I am speaking with the correct person using two identifiers. Patient/Parent Location: at home   I discussed the limitations, risks, security and privacy concerns of performing an evaluation and management service by telephone and the availability of in person appointments. I also discussed with the parents that there may be a patient responsible charge related to this service. The parents expressed understanding and agreed to proceed.  Provider: Carolann Littler, NP  Location: private location  HISTORY/CURRENT STATUS: Kevin Zimmerman is here for medication management of the psychoactive medications for ADHD and review of educational and behavioral concerns.   Kevin Zimmerman currently taking Concerta 27 mg daily, which is working well. Takes medication daily as indicated. Medication tends to wear off around evening. Kevin Zimmerman is able to focus through school/homework.   Kevin Zimmerman is eating well (eating breakfast, lunch and dinner). Eating well   Sleeping well (getting plenty of sleep with no issues), sleeping through the night.   EDUCATION: School: [employer] and in finals week now.  Year/Grade: college  Performance/ Grades: average Services: Other: none  Spring semester with 14 or 15 credits Work: part-time at [employer].  Brindon is currently in distance learning due to social distancing due to COVID-19 and will continue for at least: for the Fall semester.   Activities/ Exercise: intermittently   Screen time: (phone, tablet, TV, computer): computer for schooling, phone, and TV.   MEDICAL HISTORY: Individual Medical History/ Review of Systems: Changes? :None reported recently.   Family Medical/ Social History: Changes? None  Patient Lives with: parents  Current Medications:  Current Outpatient Medications  Medication Instructions  . albuterol (PROVENTIL HFA;VENTOLIN HFA) 108 (90 Base) MCG/ACT inhaler 2 puffs, Inhalation, Every 6 hours PRN  . EPINEPHrine (EPI-PEN) 0.3 mg, Intramuscular,  Once  . fluticasone-salmeterol (ADVAIR HFA) 115-21 MCG/ACT inhaler 2 puffs, Inhalation,  Every morning - 10a  . ibuprofen (ADVIL) 200 mg, Oral, Every 6 hours PRN, Reported on 08/29/2015  . methylphenidate (CONCERTA) 27 mg, Oral, BH-each morning  . triamcinolone cream (KENALOG) 0.1 % APP EXT ON THE SKIN BID PRF ECZEMA   Medication Side Effects: None  MENTAL HEALTH: Mental Health Issues:   Anxiety- increased with feeling overwhelmed.   DIAGNOSES:    ICD-10-CM   1. ADHD (attention deficit hyperactivity disorder), combined type  F90.2   2. Anxiety  F41.9   3. Dysgraphia  R27.8   4. Learning disability  F81.9   5. Mild intermittent asthma without complication  A999333   6. Sinus tachycardia  R00.0   7. Medication management  Z79.899   8. Patient counseled  Z71.9    RECOMMENDATIONS:  Discussed recent history with patient with updates with progress for online schooling, academics, exam, stressors, organization, health and medication.   Discussed school academic progress and recommended continued accommodations for the remainder of the school year as needed.  Suggested some resources about using distance learning with ADHD and assisting with organization/time management at college.   Discussed continued need  for structure, routine, reward (external), motivation (internal), positive reinforcement, consequences, and organization with school and virtual learning.   Encouraged recommended  limitations on TV, tablets, phones, video games and computers for non-educational activities.   Discussed need for bedtime routine, use of good sleep hygiene, no video games, TV or phones for an hour before bedtime.   Encouraged physical activity and outdoor play, maintaining social distancing.   Counseled medication pharmacokinetics, options, dosage, administration, desired effects, and possible side effects.   Concerta 27 mg daily, # 30 with no RF's. RX for above e-scribed and sent to pharmacy on record  [facility] [place], [place] [address] Phone: [phone] Fax: [phone]  I discussed the assessment and treatment plan with the patient. The patient was provided an opportunity to ask questions and all were answered. The patient agreed with the plan and demonstrated an understanding of the instructions.   I provided 25 minutes of non-face-to-face time during this encounter. Completed record review for 10 minutes prior to the virtual video visit.   NEXT APPOINTMENT:  No follow-ups on file.  The patient was advised to call back or seek an in-person evaluation if the symptoms worsen or if the condition fails to improve as anticipated.  Medical Decision-making: More than 50% of the appointment was spent counseling and discussing diagnosis and management of symptoms with the patient and family.  Carolann Littler, NP

## 2019-05-13 ENCOUNTER — Ambulatory Visit: Payer: 59 | Attending: Internal Medicine

## 2019-05-13 ENCOUNTER — Ambulatory Visit: Payer: Self-pay | Attending: Internal Medicine

## 2019-05-13 DIAGNOSIS — Z20822 Contact with and (suspected) exposure to covid-19: Secondary | ICD-10-CM | POA: Insufficient documentation

## 2019-05-14 LAB — NOVEL CORONAVIRUS, NAA: SARS-CoV-2, NAA: NOT DETECTED

## 2019-06-23 ENCOUNTER — Ambulatory Visit (INDEPENDENT_AMBULATORY_CARE_PROVIDER_SITE_OTHER): Payer: 59 | Admitting: Family

## 2019-06-23 ENCOUNTER — Encounter: Payer: Self-pay | Admitting: Family

## 2019-06-23 DIAGNOSIS — F419 Anxiety disorder, unspecified: Secondary | ICD-10-CM

## 2019-06-23 DIAGNOSIS — Z719 Counseling, unspecified: Secondary | ICD-10-CM

## 2019-06-23 DIAGNOSIS — J4520 Mild intermittent asthma, uncomplicated: Secondary | ICD-10-CM

## 2019-06-23 DIAGNOSIS — R278 Other lack of coordination: Secondary | ICD-10-CM | POA: Diagnosis not present

## 2019-06-23 DIAGNOSIS — R000 Tachycardia, unspecified: Secondary | ICD-10-CM

## 2019-06-23 DIAGNOSIS — Z79899 Other long term (current) drug therapy: Secondary | ICD-10-CM

## 2019-06-23 DIAGNOSIS — F819 Developmental disorder of scholastic skills, unspecified: Secondary | ICD-10-CM | POA: Diagnosis not present

## 2019-06-23 DIAGNOSIS — F902 Attention-deficit hyperactivity disorder, combined type: Secondary | ICD-10-CM | POA: Diagnosis not present

## 2019-06-23 NOTE — Progress Notes (Signed)
[facility] [place] [address] Dept: [phone] Dept Fax: [phone]  Medication Check visit via Virtual Video due to COVID-19  Patient ID:  Kevin Zimmerman  male DOB: 11/26/2000   19 y.o.   MRN: [id number]   DATE:06/23/19  PCP: Vivi Barrack, MD  Virtual Visit via Video Note  I connected with  DWON TRAUB on 06/23/19 at  2:00 PM EST by a video enabled telemedicine application and verified that I am speaking with the correct person using two identifiers. Patient/Parent Location: at school   I discussed the limitations, risks, security and privacy concerns of performing an evaluation and management service by telephone and the availability of in person appointments. I also discussed with the parents that there may be a patient responsible charge related to this service. The parents expressed understanding and agreed to proceed.  Provider: Carolann Littler, NP  Location: private location  HISTORY/CURRENT STATUS: EDYSON GILLS is here for medication management of the psychoactive medications for ADHD and review of educational and behavioral concerns.   Bryse currently taking Concerta when he remembers, which is working well. Takes medication as he remembers to in the middle of the day. Medication tends to wear off around evening time. Jeramia is able to focus through school/homework.   Rohit is eating well (eating breakfast, lunch and dinner). No changes with eating this semester.   Sleeping well (getting plenty of sleep), sleeping through the night.   EDUCATION: School: [employer]  Year/Grade: college  Performance/ Grades: average Services: Other: None now 5 classes this semester Working:at [employer] every other weekend.   Ezreal is currently in distance learning due to social distancing due to COVID-19 and will continue through: for the spring semester  Activities/  Exercise: intermittently-outside at school, walking at least 3-4 miles on campus.   Screen time: (phone, tablet, TV, computer): computer for learning, phone, TV, and movies with limited.   MEDICAL HISTORY: Individual Medical History/ Review of Systems: Changes? :None   Family Medical/ Social History: Changes? None reported by patient.  Patient Lives with: on campus with 1 roommate  Current Medications:  Current Outpatient Medications on File Prior to Visit  Medication Sig Dispense Refill  . albuterol (PROVENTIL HFA;VENTOLIN HFA) 108 (90 Base) MCG/ACT inhaler Inhale 2 puffs into the lungs every 6 (six) hours as needed for wheezing. 1 Inhaler 1  . EPINEPHrine 0.3 mg/0.3 mL IJ SOAJ injection Inject 0.3 mLs (0.3 mg total) into the muscle once. 1 Device 2  . fluticasone-salmeterol (ADVAIR HFA) 115-21 MCG/ACT inhaler Inhale 2 puffs into the lungs every morning. 1 Inhaler 1  . methylphenidate (CONCERTA) 27 MG PO CR tablet Take 1 tablet (27 mg total) by mouth every morning. 30 tablet 0  . ibuprofen (ADVIL,MOTRIN) 200 MG tablet Take 200 mg by mouth every 6 (six) hours as needed for pain. Reported on 08/29/2015    . triamcinolone cream (KENALOG) 0.1 % APP EXT ON THE SKIN BID PRF ECZEMA     No current facility-administered medications on file prior to visit.   Medication Side Effects: None  MENTAL HEALTH: Mental Health Issues:   Anxiety-more recently with school work and issues.   DIAGNOSES:    ICD-10-CM   1. ADHD (attention deficit hyperactivity disorder), combined type  F90.2   2. Learning disability  F81.9   3. Dysgraphia  R27.8   4. Anxiety  F41.9   5. Sinus tachycardia  R00.0  6. Mild intermittent asthma without complication  A999333   7. Medication management  Z79.899   8. Patient counseled  Z71.9     RECOMMENDATIONS:  Discussed recent history with patient with updates for school, learning, academic schedule, health, anxiety and medication.  Reviewed medication adherence with  setting alarms, putting near his phone or computer and notes to assist with reminders for daily dosing of medication.   Suggestion for decreasing anxiety with school by consistency with medication, time management and organization.   Discussed school academic progress and recommended continued accommodations as needed for learning success on campus.   Discussed health and current weight related to recent weight loss with concerns by patient. Recommended making each meal calorie dense by increasing calories in foods like using whole milk and 4% yogurt, adding butter and sour cream. Encourage foods like lunch meat, peanut butter and cheese. Offer afternoon and bedtime snacks when appetite is not suppressed by the medicine. Encourage healthy meal choices, not just snacking on junk.   Discussed continued need for structure, routine, reward (external), motivation (internal), positive reinforcement, consequences, and organization with school and academics on campus.   Encouraged recommended limitations on TV, tablets, phones, video games and computers for non-educational activities.   Discussed need for bedtime routine, use of good sleep hygiene, no video games, TV or phones for an hour before bedtime.   Encouraged physical activity and outdoor play, maintaining social distancing.   Counseled medication pharmacokinetics, options, dosage, administration, desired effects, and possible side effects.   Concerta, not taking consistently.    I discussed the assessment and treatment plan with the patient. The patient was provided an opportunity to ask questions and all were answered. The patient agreed with the plan and demonstrated an understanding of the instructions.   I provided 25 minutes of non-face-to-face time during this encounter.   Completed record review for 10 minutes prior to the virtual video visit.   NEXT APPOINTMENT:  Return in about 3 months (around 09/23/2019) for follow up  visit.  The patient was advised to call back or seek an in-person evaluation if the symptoms worsen or if the condition fails to improve as anticipated.  Medical Decision-making: More than 50% of the appointment was spent counseling and discussing diagnosis and management of symptoms with the patient and family.  Carolann Littler, NP

## 2019-09-18 ENCOUNTER — Other Ambulatory Visit: Payer: Self-pay

## 2019-09-18 DIAGNOSIS — Z20822 Contact with and (suspected) exposure to covid-19: Secondary | ICD-10-CM

## 2019-09-19 LAB — SARS-COV-2, NAA 2 DAY TAT

## 2019-09-19 LAB — NOVEL CORONAVIRUS, NAA: SARS-CoV-2, NAA: NOT DETECTED

## 2019-09-24 ENCOUNTER — Other Ambulatory Visit: Payer: Self-pay

## 2019-09-24 DIAGNOSIS — Z20822 Contact with and (suspected) exposure to covid-19: Secondary | ICD-10-CM | POA: Diagnosis not present

## 2019-09-25 LAB — NOVEL CORONAVIRUS, NAA: SARS-CoV-2, NAA: NOT DETECTED

## 2019-09-25 LAB — SARS-COV-2, NAA 2 DAY TAT

## 2019-09-27 ENCOUNTER — Other Ambulatory Visit: Payer: Self-pay

## 2019-09-27 ENCOUNTER — Ambulatory Visit (INDEPENDENT_AMBULATORY_CARE_PROVIDER_SITE_OTHER): Payer: 59 | Admitting: Family

## 2019-09-27 ENCOUNTER — Encounter: Payer: Self-pay | Admitting: Family

## 2019-09-27 VITALS — BP 106/68 | HR 74 | Resp 16 | Ht 67.0 in | Wt 137.0 lb

## 2019-09-27 DIAGNOSIS — Z7189 Other specified counseling: Secondary | ICD-10-CM

## 2019-09-27 DIAGNOSIS — Z79899 Other long term (current) drug therapy: Secondary | ICD-10-CM

## 2019-09-27 DIAGNOSIS — F902 Attention-deficit hyperactivity disorder, combined type: Secondary | ICD-10-CM

## 2019-09-27 DIAGNOSIS — F819 Developmental disorder of scholastic skills, unspecified: Secondary | ICD-10-CM

## 2019-09-27 DIAGNOSIS — R278 Other lack of coordination: Secondary | ICD-10-CM | POA: Diagnosis not present

## 2019-09-27 DIAGNOSIS — F419 Anxiety disorder, unspecified: Secondary | ICD-10-CM

## 2019-09-27 DIAGNOSIS — Z719 Counseling, unspecified: Secondary | ICD-10-CM

## 2019-09-27 MED ORDER — METHYLPHENIDATE HCL ER (OSM) 27 MG PO TBCR: 27.0000 mg | EXTENDED_RELEASE_TABLET | ORAL | 0 refills | Status: DC

## 2019-09-27 NOTE — Progress Notes (Addendum)
[facility] [address] [place] [address] Dept: [phone] Dept Fax: [phone] Loc: [phone] Loc Fax: [phone]  Medication Check  Patient ID: Kevin Zimmerman, male  DOB: [birth year], 19 y.o.  MRN: [id number]  Date of Evaluation: 09/27/2019 PCP: Vivi Barrack, MD  Accompanied by: self Patient Lives with: parents  HISTORY/CURRENT STATUS: HPI Patient here by himself for the visit. Patient interactive and appropriate with provider at the visit. Did well first semester last year, but struggled the 2nd semester of school related to COVID-19 restrictions and limited interactions outside of his dorm room at college. Working now at [employer] and seems that his anxiety and depressed mood are better. Will see how the beginning of the semester goes and has access to counseling on campus. No recent medical changes but has lost a significant amount of weight and stomach "pains" have been more of an issue. Taking his Concerta more on a regular basis with no concerns or side effects reported.   EDUCATION: School: [employer] Year/Grade: College  Performance/ Grades: below average Services: Other: services on campus Activities/ Exercise: intermittently Working this summer at the [employer] with outdoor educational experiences. 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   MEDICAL HISTORY: Appetite: Eating more at camp now and better with protein MVI/Other:  None  Sleep: Sleeping with no current issues.   Concerns: Initiation/Maintenance/Other: no issues  Individual Medical History/ Review of Systems: Changes? :None reported by patient.   Allergies: Food, Mold extract [trichophyton], and Penicillins  Current Medications: Current Outpatient Medications  Medication Instructions  . albuterol (PROVENTIL HFA;VENTOLIN HFA) 108 (90 Base) MCG/ACT inhaler 2  puffs, Inhalation, Every 6 hours PRN  . EPINEPHrine (EPI-PEN) 0.3 mg, Intramuscular,  Once  . fluticasone-salmeterol (ADVAIR HFA) 115-21 MCG/ACT inhaler 2 puffs, Inhalation,  Every morning - 10a  . ibuprofen (ADVIL) 200 mg, Every 6 hours PRN  . methylphenidate (CONCERTA) 27 mg, Oral, BH-each morning  . triamcinolone cream (KENALOG) 0.1 % APP EXT ON THE SKIN BID PRF ECZEMA   Medication Side Effects: None  Family Medical/ Social History: Changes? None  MENTAL HEALTH: Mental Health Issues: Depression and Anxiety-less now at camp, always slightly anxious  PHYSICAL EXAM; Vitals:  Vitals:   09/27/19 1018  BP: 106/68  Pulse: 74  Resp: 16  Height: 5\' 7"  (1.702 m)  Weight: 137 lb (62.1 kg)  BMI (Calculated): 21.45     General Physical Exam: Unchanged from previous exam, date:None Changed:None  Testing/Developmental Screens:  not completed but discussed concerns  DIAGNOSES:    ICD-10-CM   1. ADHD (attention deficit hyperactivity disorder), combined type  F90.2   2. Learning disability  F81.9   3. Anxiety  F41.9   4. Dysgraphia  R27.8   5. Medication management  Z79.899   6. Patient counseled  Z71.9   7. Goals of care, counseling/discussion  Z71.89     RECOMMENDATIONS: Counseling at this visit included the review of old records and/or current chart with the patient with updates for academics, learning, school last semester, health and medications.   Encouraged to need to set up counseling services on campus prior to next semester for prn support.  Information reviewed based on stomach issues reported and recommended to f/u with GI and possible nutritionist if needed to change dietary habits based on GI recommendations.   Discussed recent history and today's examination with patient with no changes today  on exam.  Counseled regarding medical updates and changes since last f/u visit.    Recommended a high protein, low sugar diet, avoid sugary snacks and drinks, drink more  water, eat more fruits and vegetables, increase daily exercise.  Encourage calorie dense foods when hungry. Encourage snacks in the afternoon/evening. Add calories to food being consumed like switching to whole milk products, using instant breakfast type powders, increasing calories of foods with butter, sour cream, mayonnaise, cheese or ranch dressing. Can add potato flakes or powdered milk.   Discussed school academic and behavioral progress and advocated for appropriate accommodations as needed for learning needs.   Discussed importance of maintaining structure, routine, organization, reward, motivation and consequences with consistency school and social settings.   Counseled medication pharmacokinetics, options, dosage, administration, desired effects, and possible side effects.   Concerta 27 mg daily, # 30 with no RF's RX for above e-scribed and sent to pharmacy on record  [place], [place]. [place] [address] Phone: [phone] Fax: [phone]  Advised importance of:  Good sleep hygiene (8- 10 hours per night, no TV or video games for 1 hour before bedtime) Limited screen time (none on school nights, no more than 2 hours/day on weekends, use of screen time for motivation) Regular exercise(outside and active play) Healthy eating (drink water or milk, no sodas/sweet tea, limit portions and no seconds).   NEXT APPOINTMENT: Return in about 3 months (around 12/28/2019) for f/u visit.  Medical Decision-making: More than 50% of the appointment was spent counseling and discussing diagnosis and management of symptoms with the patient and family.  Carolann Littler, NP Counseling Time: 25 mins  Total Contact Time: 30 mins

## 2019-09-30 ENCOUNTER — Other Ambulatory Visit: Payer: Self-pay

## 2019-09-30 DIAGNOSIS — Z20822 Contact with and (suspected) exposure to covid-19: Secondary | ICD-10-CM

## 2019-10-01 LAB — NOVEL CORONAVIRUS, NAA: SARS-CoV-2, NAA: NOT DETECTED

## 2019-10-01 LAB — SARS-COV-2, NAA 2 DAY TAT

## 2019-11-04 ENCOUNTER — Telehealth: Payer: Self-pay

## 2019-11-04 NOTE — Telephone Encounter (Signed)
Called Patient on following dates for follow up with DPL: 09/29/2019 @ 11:24am VMF, 10/07/2019 @ 10:12am VMF, 10/28/2019 @ 8:20am VMF, 11/04/2019 @ 10:43am VMF

## 2020-06-16 DIAGNOSIS — J209 Acute bronchitis, unspecified: Secondary | ICD-10-CM | POA: Diagnosis not present

## 2020-06-16 DIAGNOSIS — J4531 Mild persistent asthma with (acute) exacerbation: Secondary | ICD-10-CM | POA: Diagnosis not present

## 2020-06-16 DIAGNOSIS — R062 Wheezing: Secondary | ICD-10-CM | POA: Diagnosis not present

## 2020-07-12 ENCOUNTER — Encounter: Payer: Self-pay | Admitting: Family

## 2020-07-12 ENCOUNTER — Ambulatory Visit: Payer: BC Managed Care – PPO | Admitting: Family

## 2020-07-12 ENCOUNTER — Other Ambulatory Visit: Payer: Self-pay

## 2020-07-12 VITALS — BP 112/64 | HR 74 | Resp 16 | Ht 67.0 in | Wt 131.6 lb

## 2020-07-12 DIAGNOSIS — R278 Other lack of coordination: Secondary | ICD-10-CM

## 2020-07-12 DIAGNOSIS — Z7189 Other specified counseling: Secondary | ICD-10-CM

## 2020-07-12 DIAGNOSIS — F419 Anxiety disorder, unspecified: Secondary | ICD-10-CM | POA: Diagnosis not present

## 2020-07-12 DIAGNOSIS — F902 Attention-deficit hyperactivity disorder, combined type: Secondary | ICD-10-CM | POA: Diagnosis not present

## 2020-07-12 DIAGNOSIS — Z79899 Other long term (current) drug therapy: Secondary | ICD-10-CM

## 2020-07-12 DIAGNOSIS — Z719 Counseling, unspecified: Secondary | ICD-10-CM

## 2020-07-12 DIAGNOSIS — F819 Developmental disorder of scholastic skills, unspecified: Secondary | ICD-10-CM

## 2020-07-12 MED ORDER — METHYLPHENIDATE HCL ER (OSM) 27 MG PO TBCR: 27.0000 mg | EXTENDED_RELEASE_TABLET | ORAL | 0 refills | Status: DC

## 2020-07-12 NOTE — Progress Notes (Signed)
[facility] [address], [address] [place] [address] Dept: [phone] Dept Fax: [phone] Loc: [phone] Loc Fax: [phone]  Medication Check  Patient ID: Kevin Zimmerman, male  DOB: 02/05/2001, 20 y.o.  MRN: [id number]  Date of Evaluation: 07/12/2020 PCP: Vivi Barrack, MD  Accompanied by: self Patient Lives with:3 roommates  HISTORY/CURRENT STATUS: HPI Patient here by himself for the visit today. Recently decided that he was not "made" for college. He is not working at [employer] for more than 40 hours each week. Looking at options for [employer] instead of re-enrolling in college. Restarted his Concerta due to productivity needed at work and more efficacy for the work day. No side effects reported with restarting his medications.   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   Sleep: Bedtime:10:00 pm  Awakens: 6:00 am  Concerns: Initiation/Maintenance/Other: None reported.   Individual Medical History/ Review of Systems: Changes? :None reported recently. Still having stomach issues.   Allergies: Food, Mold extract [trichophyton], and Penicillins  Current Medications: Current Outpatient Medications  Medication Instructions  . albuterol (PROVENTIL HFA;VENTOLIN HFA) 108 (90 Base) MCG/ACT inhaler 2 puffs, Inhalation, Every 6 hours PRN  . EPINEPHrine (EPI-PEN) 0.3 mg, Intramuscular,  Once  . fluticasone-salmeterol (ADVAIR HFA) 115-21 MCG/ACT inhaler 2 puffs, Inhalation, Every morning  . ibuprofen (ADVIL) 200 mg, Every 6 hours PRN  . methylphenidate (CONCERTA) 27 mg, Oral, BH-each morning  . triamcinolone cream (KENALOG) 0.1 % APP EXT ON THE  SKIN BID PRF ECZEMA   Medication Side Effects: None  Family Medical/ Social History: Changes? None reported recently. Parents bought a place at [facility].   MENTAL HEALTH: Mental Health Issues: Anxiety-less now not enrolled in school. No need for treatment or therapy.   PHYSICAL EXAM; Vitals: Vitals:   07/12/20 1109  BP: 112/64  Pulse: 74  Resp: 16  Height: 5\' 7"  (1.702 m)  Weight: 131 lb 9.6 oz (59.7 kg)  BMI (Calculated): 20.61   Physical Exam Vitals reviewed.  Constitutional:      Appearance: Normal appearance. He is well-developed and normal weight.  HENT:     Head: Normocephalic and atraumatic.     Right Ear: Tympanic membrane, ear canal and external ear normal.     Left Ear: Tympanic membrane, ear canal and external ear normal.     Nose: Nose normal.     Mouth/Throat:     Mouth: Mucous membranes are moist.     Pharynx: Oropharynx is clear.  Eyes:     Extraocular Movements: Extraocular movements intact.     Conjunctiva/sclera: Conjunctivae normal.     Pupils: Pupils are equal, round, and reactive to light.  Cardiovascular:     Rate and Rhythm: Normal rate and regular rhythm.     Pulses: Normal pulses.     Heart sounds: Normal heart sounds.  Pulmonary:     Effort: Pulmonary effort is normal.     Breath sounds: Normal breath sounds.  Abdominal:     General: Bowel sounds are normal.     Palpations: Abdomen is soft.  Musculoskeletal:        General: Normal range of motion.     Cervical back: Normal range of motion and neck supple.  Skin:  General: Skin is warm and dry.     Capillary Refill: Capillary refill takes less than 2 seconds.  Neurological:     General: No focal deficit present.     Mental Status: He is alert and oriented to person, place, and time.  Psychiatric:        Mood and Affect: Mood normal.        Behavior: Behavior normal.        Thought Content: Thought content normal.        Judgment: Judgment normal.   General Physical  Exam: Unchanged from previous exam, date: last f/u date  Changed:None  DIAGNOSES:    ICD-10-CM   1. ADHD (attention deficit hyperactivity disorder), combined type  F90.2   2. Learning disability  F81.9   3. Anxiety  F41.9   4. Dysgraphia  R27.8   5. Medication management  Z79.899   6. Patient counseled  Z71.9   7. Goals of care, counseling/discussion  Z71.89    ASSESSMENT: Patient currently not in school and decided that the academic intensity was not for him. Now having less anxiety not having to worry about school or homework. Now working at [employer] about 50 hours each week and enjoying his job. 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 Was not taking his medication until recently due to limitation of his work potential. Just restarted his Concerta at 36 mg daily and reported efficacy, but lasting too long. No significant side effects reported. Ongoing health concern with stomach issues, but this is not new and patient watching his dietary intake. No other medical changes, family issues or sleep concerns. To discuss medication and dosing with patient today.    RECOMMENDATIONS: Patient provided recent updates regarding college, anxiety, work, plans for certification with real estate, health, family and medications.  Discussed history of school and academic difficulties. Got very overwhelmed and did not like the lock down over the past 2 years related to COVID-19. 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  Supported his goals of obtaining his real estate license and looking at [employer]. Tarvis is driven and has a plan for his future with working and limiting his debt.   Discussed his schedule with work and living with 3 other roommates. Some disagreements between others in the house, but mostly no significant  concerns. Has a daily routine and working about 50 hours/week right now.   Participating in work that is physically active and is outside when the weather permits. He is eating a vegetarian diet with good intake of protein each day. He has a history of stomach "issues" and encouraged him to f/u with dietician or GI for ongoing problems related to stomach pains.  Sleep routine with waking and going to bed has been maintained with his work schedule. Getting plenty of sleep each nigh with no reported concerns.   Counseled medication pharmacokinetics, options, dosage, administration, desired effects, and possible side effects.   Restarting Concerta 27 mg daily, at patient's request for this dose, # 30 with no RF's.RX for above e-scribed and sent to pharmacy on record  [id number] [place], [place] - [address] AT [facility] [address] [place] [address] Phone: [phone] Fax: [phone]  I discussed the assessment and treatment  plan with the patient. The patient was provided an opportunity to ask questions and all were answered. The patient agreed with the plan and demonstrated an understanding of the instructions.  NEXT APPOINTMENT: Return in about 3 months (around 10/12/2020) for f/u visit.  Carolann Littler, NP Counseling Time: 28 mins Total Contact Time: 34 mins

## 2020-09-13 ENCOUNTER — Other Ambulatory Visit: Payer: Self-pay

## 2020-09-13 ENCOUNTER — Telehealth (INDEPENDENT_AMBULATORY_CARE_PROVIDER_SITE_OTHER): Payer: BC Managed Care – PPO | Admitting: Family

## 2020-09-13 ENCOUNTER — Encounter: Payer: Self-pay | Admitting: Family

## 2020-09-13 DIAGNOSIS — F419 Anxiety disorder, unspecified: Secondary | ICD-10-CM | POA: Diagnosis not present

## 2020-09-13 DIAGNOSIS — R278 Other lack of coordination: Secondary | ICD-10-CM

## 2020-09-13 DIAGNOSIS — F902 Attention-deficit hyperactivity disorder, combined type: Secondary | ICD-10-CM

## 2020-09-13 DIAGNOSIS — Z79899 Other long term (current) drug therapy: Secondary | ICD-10-CM

## 2020-09-13 DIAGNOSIS — F819 Developmental disorder of scholastic skills, unspecified: Secondary | ICD-10-CM | POA: Diagnosis not present

## 2020-09-13 DIAGNOSIS — Z719 Counseling, unspecified: Secondary | ICD-10-CM

## 2020-09-13 DIAGNOSIS — Z7189 Other specified counseling: Secondary | ICD-10-CM

## 2020-09-13 NOTE — Progress Notes (Signed)
[facility]. [address] [place] [address] Dept: [phone] Dept Fax: [phone]  Medication Check visit via Virtual Video   Patient ID:  Kevin Zimmerman  male DOB: [birth year]   20 y.o.   MRN: [id number]   DATE:09/13/20  PCP: Vivi Barrack, MD  Virtual Visit via Video Note  I connected with  Kevin Zimmerman on 09/13/20 at  9:30 AM EDT by a video enabled telemedicine application and verified that I am speaking with Kevin correct person using two identifiers. Patient/Parent Location: at home   I discussed Kevin limitations, risks, security and privacy concerns of performing an evaluation and management service by telephone and Kevin availability of in person appointments. I also discussed with Kevin Zimmerman that there may be a patient responsible charge related to this service. Kevin Zimmerman expressed understanding and agreed to proceed.  Provider: Carolann Littler, NP  Location: Zimmerman location  HPI/CURRENT STATUS: Kevin Zimmerman is here for medication management of Kevin psychoactive medications for ADHD and review of educational and behavioral concerns.   Kevin Zimmerman currently taking Concerta 27 mg as needed for longer Zimmerman days, which is working well. Takes medication in Kevin morning. Medication tends to last for Kevin entire Zimmerman day. Kevin Zimmerman.   Kevin Zimmerman is eating well (eating breakfast, lunch and dinner). Eating ok with still ongoing Zimmerman.   Sleeping well (goes to bed at 10:00 pm wakes at 6-6:30 am), sleeping through Kevin night.   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  Looking at Kevin Zimmerman  Activities/ Exercise: daily with Zimmerman and outdoor activities.   Screen time: (phone, tablet, TV, computer): phone, TV, and movies.   MEDICAL HISTORY: Kevin Medical History/ Review of Systems: GI  to be scheduled for ongoing stomach Zimmerman.   Family Medical/ Social History: Changes? None Patient Lives with: roommates in [place]: Mental Health Zimmerman:   None reported    Allergies: Allergies  Allergen Reactions  . Food Anaphylaxis    Tested positive to peanuts years ago but tolerates peanuts well at present. Had an anaphylactic reaction to micro-protein Kevin Zimmerman) about one year ago that required treatment with steroids and overnight hospitalization.  . 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 Penicillins Anaphylaxis   Current Medications:  Current Outpatient Medications  Medication Instructions  . albuterol (PROVENTIL HFA;VENTOLIN HFA) 108 (90 Base) MCG/ACT inhaler 2 puffs, Inhalation, Every 6 hours PRN  . EPINEPHrine (EPI-PEN) 0.3 mg, Intramuscular,  Once  . fluticasone-salmeterol (ADVAIR HFA) 115-21 MCG/ACT inhaler 2 puffs, Inhalation, Every morning  . ibuprofen (ADVIL) 200 mg, Every 6 hours PRN  . methylphenidate (CONCERTA) 27 mg, Oral, BH-each morning  . triamcinolone cream (KENALOG) 0.1 % APP EXT ON Kevin SKIN BID PRF ECZEMA   Medication Side Effects: None  DIAGNOSES:    ICD-10-CM   1. ADHD (attention deficit hyperactivity disorder), combined type  F90.2   2. Learning disability  F81.9   3. Anxiety  F41.9   4. Dysgraphia  R27.8   5. Medication management  Z79.899   6. Patient counseled  Z71.9   7. Goals of care, counseling/discussion  Z71.89    ASSESSMENT: Patient is doing well working full-time at [employer] in [place], [place]. 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 No need for assistance or help with his job  duties. Kevin Zimmerman is thinking about pursuing a real estate license. Has continued with Concerta 27 mg daily on long Zimmerman days with good efficacy. No side effects or adverse effects reported. No changes since last f/u visit. To continue with current medications and reassess in 3 months.   PLAN/RECOMMENDATIONS:   Patient provided updates regarding to Zimmerman, progress, job title change, and pursuing Personal assistant for career change.  Patient not needing any assistance with Zimmerman related duties and progressing with his current job at [employer]. Recent promotion with no concerns.   [name] now happier with less stressors from school and continuing to Zimmerman with no concerns. No recent anxiety or feelings of being overwhelmed.   More activity with Zimmerman and busy season to start. Likes being outdoors and participating in outside activities. Zimmerman tends to keep him physically active as well.  Supported Kevin Zimmerman getting plenty of calories in daily along with protein. Supported water intake daily for hydration with working long hours and weather getting warmer.   Still living in [place] with 3 other roommate. Has a daily routine that keeps him busy, so not much time being spent at home with 50 hour Zimmerman weeks.   He has a continued history of stomach "Zimmerman" and encouraged him to f/u with dietician or GI for ongoing problems related to stomach pains.  Counseled medication pharmacokinetics, options, dosage, administration, desired effects, and possible side effects.   Concerta 27 mg on some Zimmerman days, no Rx today.  Sleep routine with waking and going to bed has been maintained with his Zimmerman schedule. Getting plenty of sleep each nigh with no reported concerns.   I discussed Kevin assessment and treatment plan with Kevin patient. Kevin patient was provided an opportunity to ask questions and all were answered. Kevin patient agreed with Kevin plan and demonstrated an understanding of Kevin instructions.   I provided 30 minutes of non-face-to-face time during this encounter. Completed record review for 10 minutes prior to Kevin virtual video visit.   NEXT APPOINTMENT:  3 month f/u visit  Return in about 3 months (around 12/14/2020) for f/u visit.  Kevin patient was advised to call back or seek an in-person evaluation if Kevin  symptoms worsen or if Kevin condition fails to improve as anticipated.   Carolann Littler, NP

## 2020-09-26 ENCOUNTER — Telehealth: Payer: Self-pay | Admitting: Pediatrics

## 2020-11-23 DIAGNOSIS — J3089 Other allergic rhinitis: Secondary | ICD-10-CM | POA: Diagnosis not present

## 2020-11-23 DIAGNOSIS — J3081 Allergic rhinitis due to animal (cat) (dog) hair and dander: Secondary | ICD-10-CM | POA: Diagnosis not present

## 2020-11-23 DIAGNOSIS — J4540 Moderate persistent asthma, uncomplicated: Secondary | ICD-10-CM | POA: Diagnosis not present

## 2020-11-23 DIAGNOSIS — J301 Allergic rhinitis due to pollen: Secondary | ICD-10-CM | POA: Diagnosis not present

## 2020-12-12 ENCOUNTER — Other Ambulatory Visit: Payer: Self-pay

## 2020-12-12 ENCOUNTER — Ambulatory Visit (INDEPENDENT_AMBULATORY_CARE_PROVIDER_SITE_OTHER)
Admission: RE | Admit: 2020-12-12 | Discharge: 2020-12-12 | Disposition: A | Discharge status: Home / Self Care | Payer: BC Managed Care – PPO | Source: Ambulatory Visit | Attending: Physician Assistant | Admitting: Physician Assistant

## 2020-12-12 ENCOUNTER — Ambulatory Visit: Payer: BC Managed Care – PPO | Admitting: Physician Assistant

## 2020-12-12 ENCOUNTER — Encounter: Payer: Self-pay | Admitting: Physician Assistant

## 2020-12-12 VITALS — BP 121/81 | HR 56 | Temp 98.1°F | Ht 67.0 in | Wt 142.0 lb

## 2020-12-12 DIAGNOSIS — Z8269 Family history of other diseases of the musculoskeletal system and connective tissue: Secondary | ICD-10-CM | POA: Diagnosis not present

## 2020-12-12 DIAGNOSIS — M79675 Pain in left toe(s): Secondary | ICD-10-CM

## 2020-12-12 DIAGNOSIS — M7989 Other specified soft tissue disorders: Secondary | ICD-10-CM | POA: Diagnosis not present

## 2020-12-12 LAB — CBC WITH DIFFERENTIAL/PLATELET
Basophils Absolute: 0 10*3/uL (ref 0.0–0.1)
Basophils Relative: 0.7 % (ref 0.0–3.0)
Eosinophils Absolute: 0.3 10*3/uL (ref 0.0–0.7)
Eosinophils Relative: 4.8 % (ref 0.0–5.0)
HCT: 42.4 % (ref 39.0–52.0)
Hemoglobin: 14.9 g/dL (ref 13.0–17.0)
Lymphocytes Relative: 33.3 % (ref 12.0–46.0)
Lymphs Abs: 2 10*3/uL (ref 0.7–4.0)
MCHC: 35.1 g/dL (ref 30.0–36.0)
MCV: 91.1 fl (ref 78.0–100.0)
Monocytes Absolute: 0.5 10*3/uL (ref 0.1–1.0)
Monocytes Relative: 8.7 % (ref 3.0–12.0)
Neutro Abs: 3.2 10*3/uL (ref 1.4–7.7)
Neutrophils Relative %: 52.5 % (ref 43.0–77.0)
Platelets: 214 10*3/uL (ref 150.0–400.0)
RBC: 4.66 Mil/uL (ref 4.22–5.81)
RDW: 12.3 % (ref 11.5–15.5)
WBC: 6 10*3/uL (ref 4.0–10.5)

## 2020-12-12 LAB — COMPREHENSIVE METABOLIC PANEL
ALT: 28 U/L (ref 0–53)
AST: 19 U/L (ref 0–37)
Albumin: 4.6 g/dL (ref 3.5–5.2)
Alkaline Phosphatase: 76 U/L (ref 39–117)
BUN: 10 mg/dL (ref 6–23)
CO2: 27 mEq/L (ref 19–32)
Calcium: 9.7 mg/dL (ref 8.4–10.5)
Chloride: 103 mEq/L (ref 96–112)
Creatinine, Ser: 0.75 mg/dL (ref 0.40–1.50)
GFR: 128.71 mL/min (ref >60.00)
Glucose, Bld: 92 mg/dL (ref 70–99)
Potassium: 4.2 mEq/L (ref 3.5–5.1)
Sodium: 138 mEq/L (ref 135–145)
Total Bilirubin: 0.4 mg/dL (ref 0.2–1.2)
Total Protein: 7.3 g/dL (ref 6.0–8.3)

## 2020-12-12 LAB — URIC ACID: Uric Acid, Serum: 6 mg/dL (ref 4.0–7.8)

## 2020-12-12 LAB — SEDIMENTATION RATE: Sed Rate: 4 mm/hr (ref 0–15)

## 2020-12-12 MED ORDER — METHYLPREDNISOLONE 4 MG PO TBPK: ORAL_TABLET | ORAL | 0 refills | Status: DC

## 2020-12-12 NOTE — Progress Notes (Signed)
Acute Office Visit  Subjective:    Patient ID: Kevin Zimmerman, male    DOB: Jun 04, 2000, 20 y.o.   MRN: [id number]  Chief Complaint  Patient presents with   Foot Pain    Left Foot x 3 weeks    HPI Patient is in today for left foot pain x 3 weeks.  Patient points to a specific area of tenderness at the base of his left great toe.  He denies any injury to this area.  He feels like the pain has been gradually worsening in the last 3 weeks.  He stays pretty active, but after hiking this weekend it got tremendously worse.  He thinks he might have gout.  His family has a significant history of gout.  He personally does not have a history of this.  Range of motion is limited because of the pain today and he says it feels stiff.  He has been taking Aleve for the symptoms and this has been helping a little bit and he has been trying to stay off his foot.  Past Medical History:  Diagnosis Date   ADHD (attention deficit hyperactivity disorder)    Asthma    Colon polyps    Seasonal allergies     Past Surgical History:  Procedure Laterality Date   COLONOSCOPY     COLONOSCOPY N/A 10/09/2012   Procedure: COLONOSCOPY;  Surgeon: Oletha Blend, MD;  Location: [facility];  Service: Gastroenterology;  Laterality: N/A;    Family History  Problem Relation Age of Onset   Colon polyps Paternal Grandfather    Cancer Paternal Grandfather    Asthma Maternal Uncle    Depression Maternal Uncle    Early death Paternal Uncle    Heart disease Paternal Uncle    Hypertension Paternal Uncle    Heart attack Paternal Uncle        age 61   Cancer Maternal Grandmother    Depression Maternal Grandfather     Social History   Socioeconomic History   Marital status: Single    Spouse name: Not on file   Number of children: Not on file   Years of education: Not on file   Highest education level: Not on file  Occupational History   Not on file  Tobacco Use   Smoking status: Some Days    Types: Cigarettes     Last attempt to quit: 03/21/2017    Years since quitting: 3.7   Smokeless tobacco: Never  Vaping Use   Vaping Use: Never used  Substance and Sexual Activity   Alcohol use: Yes    Alcohol/week: 1.0 standard drink    Types: 1 Cans of beer per week   Drug use: Yes    Types: Marijuana    Comment: Rarely uses marijuana   Sexual activity: Yes    Partners: Female    Birth control/protection: Condom    Comment: One partner. Uncertain of additional contraception besides condoms.  Other Topics Concern   Not on file  Social History Narrative   Senior in high school   Social Determinants of Health   Financial Resource Strain: Not on file  Food Insecurity: Not on file  Transportation Needs: Not on file  Physical Activity: Not on file  Stress: Not on file  Social Connections: Not on file  Intimate Partner Violence: Not on file    Outpatient Medications Prior to Visit  Medication Sig Dispense Refill   albuterol (PROVENTIL HFA;VENTOLIN HFA) 108 (90 Base) MCG/ACT inhaler Inhale  2 puffs into the lungs every 6 (six) hours as needed for wheezing. 1 Inhaler 1   EPINEPHrine 0.3 mg/0.3 mL IJ SOAJ injection Inject 0.3 mLs (0.3 mg total) into the muscle once. 1 Device 2   fluticasone-salmeterol (ADVAIR HFA) 115-21 MCG/ACT inhaler Inhale 2 puffs into the lungs every morning. 1 Inhaler 1   ibuprofen (ADVIL,MOTRIN) 200 MG tablet Take 200 mg by mouth every 6 (six) hours as needed for pain. Reported on 08/29/2015     triamcinolone cream (KENALOG) 0.1 % APP EXT ON THE SKIN BID PRF ECZEMA     methylphenidate (CONCERTA) 27 MG PO CR tablet Take 1 tablet (27 mg total) by mouth every morning. 30 tablet 0   No facility-administered medications prior to visit.    Allergies  Allergen Reactions   Food Anaphylaxis    Tested positive to peanuts years ago but tolerates peanuts well at present. 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   Penicillins Anaphylaxis    Review of Systems REFER TO HPI FOR PERTINENT POSITIVES AND NEGATIVES     Objective:    Physical Exam Musculoskeletal:       Feet:    BP 121/81   Pulse (!) 56   Temp 98.1 F (36.7 C)   Ht '5\' 7"'$  (1.702 m)   Wt 142 lb (64.4 kg)   SpO2 97%   BMI 22.24 kg/m  Wt Readings from Last 3 Encounters:  12/12/20 142 lb (64.4 kg)  06/26/18 158 lb 6.4 oz (71.8 kg) (57 %, Z= 0.19)*  06/25/18 159 lb (72.1 kg) (58 %, Z= 0.21)*   * Growth percentiles are based on CDC (Boys, 2-20 Years) data.    Health Maintenance Due  Topic Date Due   Pneumococcal Vaccine 71-27 Years old (1 - PPSV23 or PCV20) 12/02/2002   COVID-19 Vaccine (1) Never done   HIV Screening  Never done   Hepatitis C Screening  Never done   INFLUENZA VACCINE  11/13/2020    There are no preventive care reminders to display for this patient.   Lab Results  Component Value Date   TSH 0.71 06/25/2018   Lab Results  Component Value Date   WBC 6.0 12/12/2020   HGB 14.9 12/12/2020   HCT 42.4 12/12/2020   MCV 91.1 12/12/2020   PLT 214.0 12/12/2020   Lab Results  Component Value Date   NA 138 12/12/2020   K 4.2 12/12/2020   CO2 27 12/12/2020   GLUCOSE 92 12/12/2020   BUN 10 12/12/2020   CREATININE 0.75 12/12/2020   BILITOT 0.4 12/12/2020   ALKPHOS 76 12/12/2020   AST 19 12/12/2020   ALT 28 12/12/2020   PROT 7.3 12/12/2020   ALBUMIN 4.6 12/12/2020   CALCIUM 9.7 12/12/2020   ANIONGAP 11 03/19/2017   GFR 128.71 12/12/2020   No results found for: CHOL No results found for: HDL No results found for: LDLCALC No results found for: TRIG No results found for: CHOLHDL No results found for: HGBA1C     Assessment & Plan:   Problem List Items Addressed This Visit   None Visit Diagnoses     Pain of left great toe    -  Primary   Relevant Orders   CBC with Differential/Platelet (Completed)   Sedimentation rate  (Completed)   Comprehensive metabolic panel (Completed)   Uric acid (Completed)   DG Toe Great Left  Family history of gout       Relevant Orders   CBC with Differential/Platelet (Completed)   Sedimentation rate (Completed)   Comprehensive metabolic panel (Completed)   Uric acid (Completed)   DG Toe Great Left        Meds ordered this encounter  Medications   methylPREDNISolone (MEDROL DOSEPAK) 4 MG TBPK tablet    Sig: Please take per packaging instructions.    Dispense:  21 tablet    Refill:  0   1. Pain of left great toe 2. Family history of gout -Strong suspicion of gout -Labs and XRAY today -Medrol dose pak -Limit extra activity on feet right now until symptoms improve -Continue to treat pending results   Yuepheng Schaller M Bethel Gaglio, PA-C

## 2020-12-12 NOTE — Patient Instructions (Addendum)
I agree, this looks suspicious for gout. Labs today, will call with results. Also need XRAY of this toe done at [facility] (see handout). Start on medrol dose pak. No NSAIDs (Aleve, Ibuprofen) while taking this.  Limit activity until this starts to improve.

## 2021-02-05 ENCOUNTER — Ambulatory Visit: Payer: BC Managed Care – PPO | Admitting: Family

## 2021-02-05 ENCOUNTER — Telehealth: Payer: Self-pay

## 2021-02-05 ENCOUNTER — Encounter: Payer: Self-pay | Admitting: Family

## 2021-02-05 ENCOUNTER — Other Ambulatory Visit: Payer: Self-pay

## 2021-02-05 VITALS — BP 128/78 | HR 78 | Resp 16 | Ht 67.0 in | Wt 153.0 lb

## 2021-02-05 DIAGNOSIS — Z719 Counseling, unspecified: Secondary | ICD-10-CM

## 2021-02-05 DIAGNOSIS — R278 Other lack of coordination: Secondary | ICD-10-CM | POA: Diagnosis not present

## 2021-02-05 DIAGNOSIS — Z5320 Procedure and treatment not carried out because of patient's decision for unspecified reasons: Secondary | ICD-10-CM

## 2021-02-05 DIAGNOSIS — F419 Anxiety disorder, unspecified: Secondary | ICD-10-CM | POA: Diagnosis not present

## 2021-02-05 DIAGNOSIS — Z7189 Other specified counseling: Secondary | ICD-10-CM

## 2021-02-05 DIAGNOSIS — Z8659 Personal history of other mental and behavioral disorders: Secondary | ICD-10-CM | POA: Diagnosis not present

## 2021-02-05 NOTE — Telephone Encounter (Signed)
Pt called stating that he needs print outs of all of his office appts for [place]. Pt stated that he just needs the dates of the visits printed so he can pick them up. Please Advise.

## 2021-02-05 NOTE — Progress Notes (Signed)
Medication Check  Patient ID: Kevin Zimmerman  DOB: [birth year]  MRN: [id number]  DATE:02/05/21 Kevin Barrack, MD  Accompanied by:  self Patient Lives with: parents  HISTORY/CURRENT STATUS: HPI Patient here by himself for the visit today Patient interactive and appropriate with provider today. Patient has been off of his Concerta since April of 2022. Last Rx written at the last f/u in March 2022. Not been consistent with medication adherence over the past few years. 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: 1-2 days/week  Activities/ Exercise: intermittently-outside activity and hiking.   Screen time: (phone, tablet, TV, computer): TV, phone and games Driving: Yes with no recent issues or accidents  MEDICAL HISTORY: Appetite: Good    Sleep: Bedtime: 10:00 pm  Awakens: 7-8:00 am    Concerns: Initiation/Maintenance/Other: No concerns Elimination: No issues reported.   Individual Medical History/ Review of Systems: Changes? : Not currently having any medical issues.   Family Medical/ Social History: Changes? None  MENTAL HEALTH: None reported  PHYSICAL EXAM;  Vitals:   02/05/21 1337  BP: 128/78  Pulse: 78  Resp: 16  Weight: 153 lb (69.4 kg)  Height: 5\' 7"  (1.702 m)    General Physical Exam: Unchanged from previous exam, date: 09/13/2020   Side Effects (None 0, Mild 1, Moderate 2, Severe 3)  Headache 0  Stomachache 0  Change of appetite 0  Trouble sleeping 0  Irritability in the later morning, later afternoon , or evening 0  Socially withdrawn - decreased interaction with others 0  Extreme sadness or unusual crying 0  Dull, tired, listless behavior 0  Tremors/feeling  shaky 0  Repetitive movements, tics, jerking, twitching, eye blinking 0  Picking at skin or fingers nail biting, lip or cheek chewing 0  Sees or hears things that aren't there 0  Comments:  None   ASSESSMENT:  Kevin Zimmerman is 54-years of age with a history of ADHD that is resolved with last Rx on 07/12/2020. Not currently taking medication and last took his Concerta for work days on occasion between March and April 2022. 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 Sleeping well and eating with no difficulties. Staying active and participating in outdoor activities. NO medications or need for f/u visits.   DIAGNOSES:    ICD-10-CM   1. History of ADHD  Z86.59     2. Anxiety  F41.9     3. Dysgraphia  R27.8     4. Medication refused  Z53.20     5. Patient counseled  Z71.9     6. Goals of care, counseling/discussion  Z71.89      RECOMMENDATIONS:  Updates provided with work, health and medication discontinuation.   Discussed no medication over the past few months. Last RF in March of 2022 with last time used for work was in April of 2022.  Patient reports functioning well with no current issues with  work, home, family or social interactions.  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  Plan is to obtain clearance for solo flying time to log hours toward his pilot license. Support and encouragement provided.   No medication and has maintained success off medication with ability to function.   I discussed the assessment and treatment plan with the patient. The patient was provided an opportunity to ask questions and all  were answered. The patient agreed with the plan and demonstrated an understanding of the instructions.  NEXT APPOINTMENT:  Return if symptoms worsen or fail to improve.

## 2021-02-06 NOTE — Telephone Encounter (Signed)
Unable to reach patient, voice mail is full Patient need to sign a record release form

## 2021-04-18 DIAGNOSIS — J4530 Mild persistent asthma, uncomplicated: Secondary | ICD-10-CM | POA: Diagnosis not present

## 2021-06-18 ENCOUNTER — Encounter: Payer: Self-pay | Admitting: Family Medicine

## 2021-06-18 ENCOUNTER — Other Ambulatory Visit: Payer: Self-pay

## 2021-06-18 ENCOUNTER — Ambulatory Visit: Payer: BC Managed Care – PPO | Admitting: Family Medicine

## 2021-06-18 VITALS — BP 124/78 | HR 82 | Temp 98.4°F | Ht 67.0 in | Wt 169.0 lb

## 2021-06-18 DIAGNOSIS — J029 Acute pharyngitis, unspecified: Secondary | ICD-10-CM

## 2021-06-18 DIAGNOSIS — Z23 Encounter for immunization: Secondary | ICD-10-CM

## 2021-06-18 DIAGNOSIS — R059 Cough, unspecified: Secondary | ICD-10-CM | POA: Diagnosis not present

## 2021-06-18 DIAGNOSIS — J4520 Mild intermittent asthma, uncomplicated: Secondary | ICD-10-CM | POA: Diagnosis not present

## 2021-06-18 DIAGNOSIS — J309 Allergic rhinitis, unspecified: Secondary | ICD-10-CM

## 2021-06-18 LAB — POC COVID19 BINAXNOW: SARS Coronavirus 2 Ag: NEGATIVE

## 2021-06-18 LAB — POCT RAPID STREP A (OFFICE): Rapid Strep A Screen: NEGATIVE

## 2021-06-18 MED ORDER — AZITHROMYCIN 250 MG PO TABS: ORAL_TABLET | ORAL | 0 refills | Status: DC

## 2021-06-18 NOTE — Progress Notes (Signed)
? ?  Kevin Zimmerman is a 21 y.o. male who presents today for an office visit. ? ?Assessment/Plan:  ?New/Acute Problems: ?Sinusitis  ?No red flags.  COVID and strep both negative.  Symptoms seem to be improving.  We will continue with watchful waiting.  He can continue over-the-counter meds.  We will send in a pocket prescription for azithromycin with instruction not start unless symptoms do not improve in the next few days.  We discussed reasons to return to care.  Follow-up as needed. ? ?Chronic Problems Addressed Today: ?Asthma ?Stable.  Follows with allergist.  No signs of a flare today.  He will continue albuterol and Advair. ? ?Allergic rhinitis ?Stable.  Follows with allergist.  Could be contributing to sinus infection and we will treat as above. ? ?Tdap give today.  ? ?  ?Subjective:  ?HPI: ? ?Patient here with sore throat and cough for several days. A little better today. Cough is productive of phlegm. No fevers or chills. Mother was sick with similar symptoms. No treatments tried. No wheeze or shortness of breath.  ? ?   ?  ?Objective:  ?Physical Exam: ?BP 124/78 (BP Location: Right Arm)   Pulse 82   Temp 98.4 ?F (36.9 ?C) (Temporal)   Ht '5\' 7"'$  (1.702 m)   Wt 169 lb (76.7 kg)   SpO2 98%   BMI 26.47 kg/m?   ?Gen: No acute distress, resting comfortably ?HEENT: TMs with clear effusion.  OP erythematous. ?CV: Regular rate and rhythm with no murmurs appreciated ?Pulm: Normal work of breathing, clear to auscultation bilaterally with no crackles, wheezes, or rhonchi ?Neuro: Grossly normal, moves all extremities ?Psych: Normal affect and thought content ? ?   ? ?Algis Greenhouse. Jerline Pain, MD ?06/18/2021 12:02 PM  ?

## 2021-06-18 NOTE — Assessment & Plan Note (Signed)
Stable.  Follows with allergist.  No signs of a flare today.  He will continue albuterol and Advair. ?

## 2021-06-18 NOTE — Patient Instructions (Addendum)
It was very nice to see you today! ? ?Your COVID and strep test were both negative. ? ?Please take the azithromycin if your symptoms are not improving in the next couple of days. ? ?We will get your tetanus shot today. ? ?I will see back in a year for a physical.  Please come back to see me sooner if needed. ? ?Take care, ?Dr Jerline Pain ? ?PLEASE NOTE: ? ?If you had any lab tests please let us know if you have not heard back within a few days. You may see your results on mychart before we have a chance to review them but we will give you a call once they are reviewed by [place]. If we ordered any referrals today, please let us know if you have not heard from their office within the next week.  ? ?Please try these tips to maintain a healthy lifestyle: ? ?Eat at least 3 REAL meals and 1-2 snacks per day.  Aim for no more than 5 hours between eating.  If you eat breakfast, please do so within one hour of getting up.  ? ?Each meal should contain half fruits/vegetables, one quarter protein, and one quarter carbs (no bigger than a computer mouse) ? ?Cut down on sweet beverages. This includes juice, soda, and sweet tea.  ? ?Drink at least 1 glass of water with each meal and aim for at least 8 glasses per day ? ?Exercise at least 150 minutes every week.   ?

## 2021-06-18 NOTE — Assessment & Plan Note (Signed)
Stable.  Follows with allergist.  Could be contributing to sinus infection and we will treat as above. ?

## 2021-10-02 DIAGNOSIS — M76811 Anterior tibial syndrome, right leg: Secondary | ICD-10-CM | POA: Diagnosis not present

## 2022-01-07 ENCOUNTER — Encounter: Payer: Self-pay | Admitting: *Deleted

## 2022-03-28 ENCOUNTER — Encounter: Payer: Self-pay | Admitting: *Deleted

## 2022-05-26 IMAGING — DX DG TOE GREAT 2+V*L*
3 series · 3 of 3 positions shown · non-contrast
Comparison: None.

CLINICAL DATA: First metatarsal pain, swelling.  No known injury.

EXAM:
LEFT GREAT TOE

[toe ap]
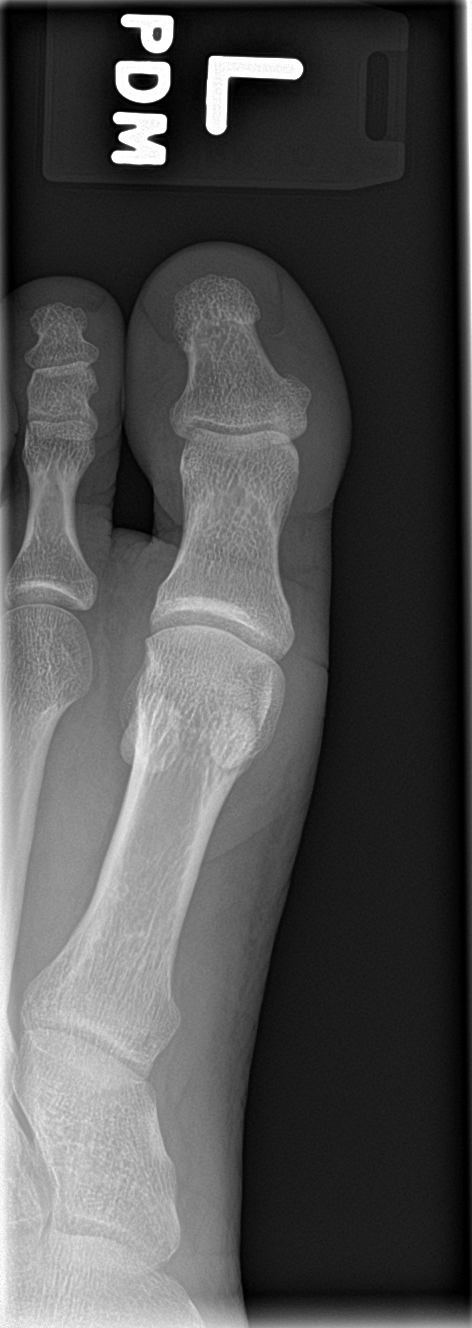

[toe obl]
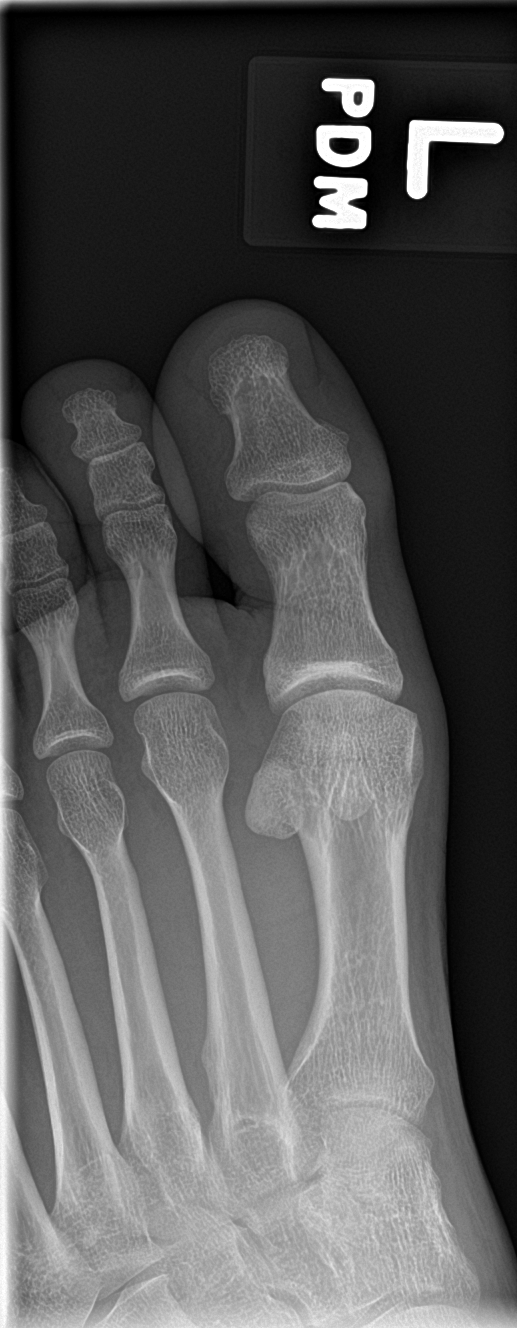

[toe lat]
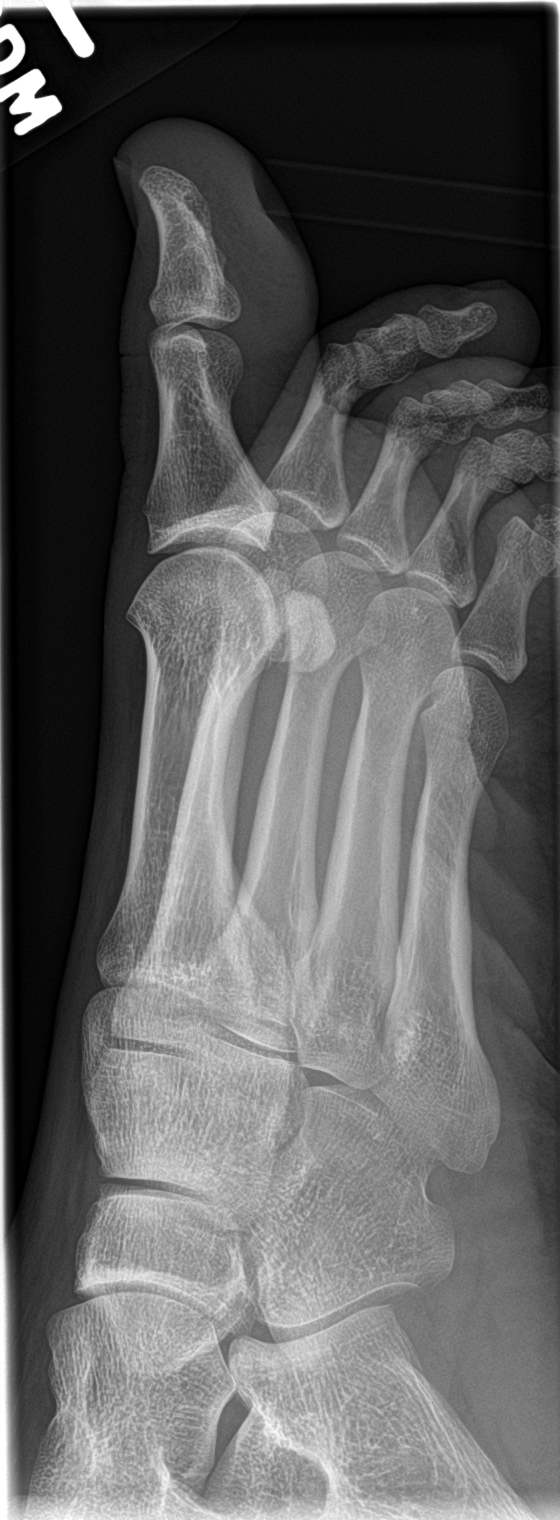

[3 of 3 positions shown; findings below may reference images not displayed]

FINDINGS: There is no evidence of fracture or dislocation. There is no
evidence of arthropathy or other focal bone abnormality. Soft
tissues are unremarkable.
IMPRESSION: Negative.

## 2022-09-18 ENCOUNTER — Encounter: Payer: BC Managed Care – PPO | Admitting: Family Medicine

## 2022-09-19 ENCOUNTER — Ambulatory Visit: Payer: BC Managed Care – PPO | Admitting: Family Medicine

## 2022-10-15 DIAGNOSIS — R1033 Periumbilical pain: Secondary | ICD-10-CM | POA: Diagnosis not present

## 2022-10-15 DIAGNOSIS — K029 Dental caries, unspecified: Secondary | ICD-10-CM | POA: Diagnosis not present

## 2022-10-15 DIAGNOSIS — R198 Other specified symptoms and signs involving the digestive system and abdomen: Secondary | ICD-10-CM | POA: Diagnosis not present

## 2022-10-15 DIAGNOSIS — R195 Other fecal abnormalities: Secondary | ICD-10-CM | POA: Diagnosis not present

## 2022-12-23 DIAGNOSIS — J4540 Moderate persistent asthma, uncomplicated: Secondary | ICD-10-CM | POA: Diagnosis not present

## 2022-12-23 DIAGNOSIS — J3089 Other allergic rhinitis: Secondary | ICD-10-CM | POA: Diagnosis not present

## 2022-12-23 DIAGNOSIS — J301 Allergic rhinitis due to pollen: Secondary | ICD-10-CM | POA: Diagnosis not present

## 2022-12-23 DIAGNOSIS — J3081 Allergic rhinitis due to animal (cat) (dog) hair and dander: Secondary | ICD-10-CM | POA: Diagnosis not present

## 2023-01-02 ENCOUNTER — Encounter: Payer: Self-pay | Admitting: Physician Assistant

## 2023-01-02 ENCOUNTER — Ambulatory Visit: Payer: BC Managed Care – PPO | Admitting: Physician Assistant

## 2023-01-02 VITALS — BP 140/90 | HR 72 | Temp 97.7°F | Ht 67.0 in | Wt 143.5 lb

## 2023-01-02 DIAGNOSIS — J029 Acute pharyngitis, unspecified: Secondary | ICD-10-CM | POA: Diagnosis not present

## 2023-01-02 DIAGNOSIS — J4541 Moderate persistent asthma with (acute) exacerbation: Secondary | ICD-10-CM

## 2023-01-02 LAB — POC INFLUENZA A&B (BINAX/QUICKVUE)
Influenza A, POC: NEGATIVE
Influenza B, POC: NEGATIVE

## 2023-01-02 LAB — POCT RAPID STREP A (OFFICE): Rapid Strep A Screen: NEGATIVE

## 2023-01-02 MED ORDER — AZITHROMYCIN 250 MG PO TABS: ORAL_TABLET | ORAL | 0 refills | Status: AC

## 2023-01-02 MED ORDER — PREDNISONE 20 MG PO TABS: 20.0000 mg | ORAL_TABLET | Freq: Two times a day (BID) | ORAL | 0 refills | Status: DC

## 2023-01-02 NOTE — Patient Instructions (Signed)
It was great to see you!  Flu and COVID tests are negative  You have a viral upper respiratory infection. Antibiotics are not needed for this.  Viral infections usually take 7-10 days to resolve.  The cough can last a few weeks to go away. Use medication as prescribed: oral prednisone (this is a steroid)  If symptoms worsen over the weekend, please start antibiotic (azithromycin)  Push fluids and get plenty of rest. Please return if you are not improving as expected, or if you have high fevers (>101.5) or difficulty swallowing or worsening productive cough.  Call clinic with questions.  I hope you start feeling better soon!

## 2023-01-02 NOTE — Progress Notes (Signed)
Kevin Zimmerman is a 22 y.o. male here for a new problem.  History of Present Illness:   Chief Complaint  Patient presents with   Sore Throat    Pt c/o sore throat, nasal congestion and cough, started Monday. Coughing and expectorating yellow sputum, nausea and fever yesterday 100.1. COVID x 2 Negative    HPI   Sore Throat: Complains of sore throat, nasal congestion, cough that began 9/16 and expectorating yellow sputum, nausea, and fever (100.3 & 100.1) 9/18.  Reports taking 2 Covid tests; both resulted negative.  States his girlfriend awoke this morning with similar symptoms.  Describes waking up with sore throat, flying down to [place], and later developed headache, sneezing, coughing, and congestion.  Notes that cough worsened yesterday with sputum production.  Reports he has been using Ibuprofen and Mucinex to manage symptoms.  States that he is prone to contracting pneumonia after a sickness and is diagnosed with severe asthma.  Reports asthma is managed with QVAR, 2 puffs in morning, but can't miss a day. Has used Albuterol inhaler to manage pulmonary symptoms.  Notes improvement in pulmonary symptoms today. Denies ear pain.  Past Medical History:  Diagnosis Date   ADHD (attention deficit hyperactivity disorder)    Asthma    Colon polyps    Seasonal allergies      Social History   Tobacco Use   Smoking status: Some Days    Current packs/day: 0.00    Types: Cigarettes    Last attempt to quit: 03/21/2017    Years since quitting: 5.7   Smokeless tobacco: Never  Vaping Use   Vaping status: Never Used  Substance Use Topics   Alcohol use: Yes    Alcohol/week: 1.0 standard drink of alcohol    Types: 1 Cans of beer per week   Drug use: Yes    Types: Marijuana    Comment: Rarely uses marijuana    Past Surgical History:  Procedure Laterality Date   COLONOSCOPY     COLONOSCOPY N/A 10/09/2012   Procedure: COLONOSCOPY;  Surgeon: Jon Gills, MD;  Location: [facility]  OR;  Service: Gastroenterology;  Laterality: N/A;    Family History  Problem Relation Age of Onset   Colon polyps Paternal Grandfather    Cancer Paternal Grandfather    Asthma Maternal Uncle    Depression Maternal Uncle    Early death Paternal Uncle    Heart disease Paternal Uncle    Hypertension Paternal Uncle    Heart attack Paternal Uncle        age 25   Cancer Maternal Grandmother    Depression Maternal Grandfather     Allergies  Allergen Reactions   Food Anaphylaxis    Tested positive to peanuts years ago but tolerates peanuts well at present. Had an anaphylactic reaction to micro-protein Patriciaann Clan) about one year ago that required treatment with steroids and overnight hospitalization.   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   Penicillins Anaphylaxis    Current Medications:   Current Outpatient Medications:    albuterol (PROVENTIL HFA;VENTOLIN HFA) 108 (90 Base) MCG/ACT inhaler, Inhale 2 puffs into the lungs every 6 (six) hours as needed for wheezing., Disp: 1 Inhaler, Rfl: 1   azithromycin (ZITHROMAX) 250 MG tablet, Take 2 tablets on day 1, then 1 tablet daily on days 2 through 5, Disp: 6 tablet, Rfl: 0   EPINEPHrine 0.3 mg/0.3 mL IJ SOAJ injection, Inject 0.3 mLs (0.3 mg total) into the muscle once., Disp: 1 Device, Rfl: 2  fluticasone-salmeterol (ADVAIR HFA) 115-21 MCG/ACT inhaler, Inhale 2 puffs into the lungs every morning., Disp: 1 Inhaler, Rfl: 1   ibuprofen (ADVIL,MOTRIN) 200 MG tablet, Take 200 mg by mouth every 6 (six) hours as needed for pain. Reported on 08/29/2015, Disp: , Rfl:    predniSONE (DELTASONE) 20 MG tablet, Take 1 tablet (20 mg total) by mouth 2 (two) times daily with a meal., Disp: 10 tablet, Rfl: 0   triamcinolone cream (KENALOG) 0.1 %, , Disp: , Rfl:    Review of Systems:   Review of Systems  Constitutional:  Positive for fever (100.3 & 100.1,  on 9/18).  HENT:  Positive for congestion (nasal) and sore throat.   Respiratory:  Positive for cough  (started dry, became productive) and sputum production (yellow).   Gastrointestinal:  Positive for nausea.    Vitals:   Vitals:   01/02/23 0915  BP: (!) 140/90  Pulse: 72  Temp: 97.7 F (36.5 C)  TempSrc: Temporal  SpO2: 98%  Weight: 143 lb 8 oz (65.1 kg)  Height: 5\' 7"  (1.702 m)     Body mass index is 22.48 kg/m.  Physical Exam:   Physical Exam Vitals and nursing note reviewed.  Constitutional:      General: He is not in acute distress.    Appearance: He is well-developed. He is not ill-appearing or toxic-appearing.  HENT:     Head: Normocephalic and atraumatic.     Right Ear: Tympanic membrane, ear canal and external ear normal. Tympanic membrane is not erythematous, retracted or bulging.     Left Ear: Tympanic membrane, ear canal and external ear normal. Tympanic membrane is not erythematous, retracted or bulging.     Nose: Nose normal.     Right Sinus: No maxillary sinus tenderness or frontal sinus tenderness.     Left Sinus: No maxillary sinus tenderness or frontal sinus tenderness.     Mouth/Throat:     Pharynx: Uvula midline. No posterior oropharyngeal erythema.  Eyes:     General: Lids are normal.     Conjunctiva/sclera: Conjunctivae normal.  Neck:     Trachea: Trachea normal.  Cardiovascular:     Rate and Rhythm: Normal rate and regular rhythm.     Pulses: Normal pulses.     Heart sounds: Normal heart sounds, S1 normal and S2 normal.  Pulmonary:     Effort: Pulmonary effort is normal.     Breath sounds: Normal breath sounds. No decreased breath sounds, wheezing, rhonchi or rales.  Lymphadenopathy:     Cervical: No cervical adenopathy.  Skin:    General: Skin is warm and dry.  Neurological:     Mental Status: He is alert.     GCS: GCS eye subscore is 4. GCS verbal subscore is 5. GCS motor subscore is 6.  Psychiatric:        Speech: Speech normal.        Behavior: Behavior normal. Behavior is cooperative.    Results for orders placed or performed in  visit on 01/02/23  POCT rapid strep A  Result Value Ref Range   Rapid Strep A Screen Negative Negative  POC Influenza A&B(BINAX/QUICKVUE)  Result Value Ref Range   Influenza A, POC Negative Negative   Influenza B, POC Negative Negative     Assessment and Plan:   Sore throat; Moderate persistent asthma with acute exacerbation No red flags on exam.  COVID and flu test negative. Suspect viral upper respiratory infection (URI) with acute asthma exacerbation. Continue qvar and  albuterol as needed Start oral prednisone as prescribed If fever returns, symptom(s) worsen -- start pocket prescription of azithromycin Discussed taking medications as prescribed. Reviewed return precautions including worsening fever, SOB, worsening cough or other concerns.  Push fluids and rest. I recommend that patient follow-up if symptoms worsen or persist despite treatment x 7-10 days, sooner if needed.   I,Emily Lagle,acting as a Neurosurgeon for [employer], PA.,have documented all relevant documentation on the behalf of Jarold Motto, PA,as directed by  Jarold Motto, PA while in the presence of Jarold Motto, [place].  I, Jarold Motto, [place], have reviewed all documentation for this visit. The documentation on 01/02/23 for the exam, diagnosis, procedures, and orders are all accurate and complete.  Jarold Motto, PA-C

## 2023-05-27 ENCOUNTER — Ambulatory Visit: Payer: Self-pay | Admitting: Family Medicine

## 2023-05-27 NOTE — Telephone Encounter (Signed)
Chief Complaint: Headache Symptoms: Headache, fever, cough, congestion Frequency: constant Pertinent Negatives: Patient denies nausea, vomiting,  Disposition: [] ED /[] Urgent Care (no appt availability in office) / [x] Appointment(In office/virtual)/ []  [facility]/ [] Home Care/ [] Refused Recommended Disposition /[] [facility]/ []  Follow-up with PCP Additional Notes: Patient stated he has a headache, fever, cough, congestions and mild chest discomfort. Patient reports most people in his office has similar symptoms but no confirmed covid or flu cases that he knows of. Care advice was given and patient has been scheduled for an OV tomorrow with PCP. Patient has Hx of asthma.   Copied from CRM [id number]. Topic: Clinical - Red Word Triage >> May 27, 2023 12:40 PM Tiffany H wrote: Red Word: Temperature. Patient advised that he's been ill for a few days with body aches and chills. His fever peaked at 101.00. Coughing, congestion, chest pain and minor sweating with fever. Please assist. Reason for Disposition  Fever present > 3 days (72 hours)  Answer Assessment - Initial Assessment Questions 1. WORST SYMPTOM: "What is your worst symptom?" (e.g., cough, runny nose, muscle aches, headache, sore throat, fever)      Headache  2. ONSET: "When did your flu symptoms start?"      Yesterday afternoon  3. COUGH: "How bad is the cough?"       Mild to moderate  4. RESPIRATORY DISTRESS: "Describe your breathing."      Normal  5. FEVER: "Do you have a fever?" If Yes, ask: "What is your temperature, how was it measured, and when did it start?"     101.0 Fever 6. EXPOSURE: "Were you exposed to someone with influenza?"       Not that I know of.  7. FLU VACCINE: "Did you get a flu shot this year?"     No 8. HIGH RISK DISEASE: "Do you have any chronic medical problems?" (e.g., heart or lung disease, asthma, weak immune system, or other HIGH RISK conditions)     Asthma  10. OTHER SYMPTOMS:  "Do you have any other symptoms?"  (e.g., runny nose, muscle aches, headache, sore throat)       Cough, congestion, chest tightness, headache, fever, body aches,  Protocols used: Influenza (Flu) - [facility]

## 2023-05-28 ENCOUNTER — Ambulatory Visit (INDEPENDENT_AMBULATORY_CARE_PROVIDER_SITE_OTHER): Payer: 59 | Admitting: Family Medicine

## 2023-05-28 ENCOUNTER — Encounter: Payer: Self-pay | Admitting: Family Medicine

## 2023-05-28 VITALS — BP 114/73 | HR 74 | Temp 97.9°F | Ht 67.0 in | Wt 138.2 lb

## 2023-05-28 DIAGNOSIS — R195 Other fecal abnormalities: Secondary | ICD-10-CM | POA: Diagnosis not present

## 2023-05-28 DIAGNOSIS — J4520 Mild intermittent asthma, uncomplicated: Secondary | ICD-10-CM

## 2023-05-28 DIAGNOSIS — J101 Influenza due to other identified influenza virus with other respiratory manifestations: Secondary | ICD-10-CM

## 2023-05-28 DIAGNOSIS — R059 Cough, unspecified: Secondary | ICD-10-CM

## 2023-05-28 LAB — POC COVID19 BINAXNOW: SARS Coronavirus 2 Ag: NEGATIVE

## 2023-05-28 LAB — POCT INFLUENZA A/B
Influenza A, POC: POSITIVE — AB
Influenza B, POC: NEGATIVE

## 2023-05-28 LAB — POCT RAPID STREP A (OFFICE): Rapid Strep A Screen: NEGATIVE

## 2023-05-28 MED ORDER — OSELTAMIVIR PHOSPHATE 75 MG PO CAPS: 75.0000 mg | ORAL_CAPSULE | Freq: Two times a day (BID) | ORAL | 0 refills | Status: DC

## 2023-05-28 MED ORDER — PREDNISONE 50 MG PO TABS: ORAL_TABLET | ORAL | 0 refills | Status: DC

## 2023-05-28 NOTE — Progress Notes (Signed)
   Kevin Zimmerman is a 23 y.o. male who presents today for an office visit.  Assessment/Plan:  New/Acute Problems: Influenza A  Rapid flu positive.  Overall reassuring exam today.  Given his comorbidities including asthma will start Tamiflu.  Encouraged hydration.  He can use over-the-counter meds as needed as well.  He will let us know if not proving.  Chronic Problems Addressed Today: Asthma Overall reassuring exam today without any signs of respiratory distress though is currently having a mild flare due to as above influenza.  Will start prednisone.  He has done well with this in the past.  He can continue his albuterol and Advair as well per allergy.  Loose stools No red flags though he does have a history of colon polyps as a child.  Based on his history sounds like he may have IBS however due to his history would be reasonable for him to see GI at this point.  Will place referral.     Subjective:  HPI:  Patient here with fever, malaise, and myalgias.  Symptoms started 2 days ago.  Associated symptoms include cough, headache, and nasal congestion.  Tried taking over-the-counter medications with some improvement. He has been using his inhalers with some improvement. He did have sick contact at work.  Symptoms worse in the last day or so.  Occasional wheezing.  No chest pain or shortness of breath.  Patient has also been having intermittent abdominal pain for the last several years.  Symptoms do seem to be worsening recently as well though this has been going on for a while even before his recent illnesses above.  He is having occasional loose stools.  Pain with bowel movements.  A lot of cramping.  He has tried modifying his diet and is eating more vegetarian however has had persistent symptoms.  No melena or hematochezia.  He did have rectal bleeding as a child and underwent multiple colonoscopies with colon polyps.  His last colonoscopy was about 10 years ago however he was reportedly  told that he would not need to have any routine follow-up after this.       Objective:  Physical Exam: BP 114/73   Pulse 74   Temp 97.9 F (36.6 C) (Temporal)   Ht 5\' 7"  (1.702 m)   Wt 138 lb 3.2 oz (62.7 kg)   SpO2 97%   BMI 21.65 kg/m   Gen: No acute distress, resting comfortably HEENT: TMs clear. . CV: Regular rate and rhythm with no murmurs appreciated Pulm: Normal work of breathing, clear to auscultation bilaterally with no crackles, wheezes, or rhonchi Neuro: Grossly normal, moves all extremities Psych: Normal affect and thought content      Kevin Zimmerman M. Jimmey Ralph, MD 05/28/2023 9:07 AM

## 2023-05-28 NOTE — Assessment & Plan Note (Signed)
No red flags though he does have a history of colon polyps as a child.  Based on his history sounds like he may have IBS however due to his history would be reasonable for him to see GI at this point.  Will place referral.

## 2023-05-28 NOTE — Patient Instructions (Signed)
It was very nice to see you today!  You have the flu.   Please start Tamiflu.  Also start the prednisone.  Make sure that you are getting plenty of fluids.  Let us know if not improving or if your symptoms worsen.  We will refer you to see the nutritionist and gastroenterologist.  Return if symptoms worsen or fail to improve.   Take care, Dr Jimmey Ralph  PLEASE NOTE:  If you had any lab tests, please let us know if you have not heard back within a few days. You may see your results on mychart before we have a chance to review them but we will give you a call once they are reviewed by [place].   If we ordered any referrals today, please let us know if you have not heard from their office within the next week.   If you had any urgent prescriptions sent in today, please check with the pharmacy within an hour of our visit to make sure the prescription was transmitted appropriately.   Please try these tips to maintain a healthy lifestyle:  Eat at least 3 REAL meals and 1-2 snacks per day.  Aim for no more than 5 hours between eating.  If you eat breakfast, please do so within one hour of getting up.   Each meal should contain half fruits/vegetables, one quarter protein, and one quarter carbs (no bigger than a computer mouse)  Cut down on sweet beverages. This includes juice, soda, and sweet tea.   Drink at least 1 glass of water with each meal and aim for at least 8 glasses per day  Exercise at least 150 minutes every week.

## 2023-05-28 NOTE — Assessment & Plan Note (Signed)
Overall reassuring exam today without any signs of respiratory distress though is currently having a mild flare due to as above influenza.  Will start prednisone.  He has done well with this in the past.  He can continue his albuterol and Advair as well per allergy.

## 2023-07-07 ENCOUNTER — Encounter: Payer: 59 | Attending: Family Medicine | Admitting: Skilled Nursing Facility1

## 2023-07-07 ENCOUNTER — Encounter: Payer: Self-pay | Admitting: Skilled Nursing Facility1

## 2023-07-07 VITALS — Ht 67.0 in | Wt 136.1 lb

## 2023-07-07 DIAGNOSIS — R195 Other fecal abnormalities: Secondary | ICD-10-CM | POA: Insufficient documentation

## 2023-07-07 NOTE — Progress Notes (Signed)
 Medical Nutrition Therapy  Appointment Start time:  1:55  Appointment End time:  2:45  Primary concerns today: Multiple loose stools a day  Referral diagnosis: r19.5  NUTRITION ASSESSMENT    Clinical Medical Hx: ADHD Medications: see list Labs: Notable Signs/Symptoms: Often tired   Lifestyle & Dietary Hx  Pt states he has been vegetarian for the last 10 years.  Pt states he feels abdominal pain every time he needs to defecate. Pt states he does sometimes feels he needs to defecate but instead has to urinate.  Pt states he does have anxiety stating he used to work with a therapist but not recently.  Pt states he has had a long history of stomach issues.  Pt states he has a bowel movement 3-4 times a day stating mostly loose stools.  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  Pt states his girlfriend is a Research scientist (life sciences). 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  Pt states he does smoke cigarettes trying to quit with nicotine gum.   Pt states he is allergic to bananas and nuts.   Pt states he does keep kosher.   Pt states he does plan on going to a GI specialist but has not gotten a call from their office: Dietitian advised to give their office a call.   Body Composition Scale 07/07/2023  Current Body Weight 136.1  Total Body Fat % 7.5  Visceral Fat 5  Fat-Free Mass % 92.5   Total Body Water % 73  Muscle-Mass lbs 26.1  BMI 21  Body Fat Displacement          Torso  lbs 6.2         Left Leg  lbs 1.2         Right Leg  lbs 1.2         Left Arm  lbs .6         Right Arm   lbs .6    Estimated daily fluid intake:  oz Supplements: N/A Sleep:  Stress / self-care: high stress level Current average weekly physical activity: ADL's; walking at work   24-Hr Dietary Recall: occasionally boost throughout the week First Meal: wakes around 5am eats around 8am: [place] or local restaurant or eggs or toast or waffles or  alternative meats  Snack: chex mix or bagel or fruit salad Second Meal: bag of snacks or skipped  Snack:  Third Meal: grilled cheese with tomato soup and avocado dip Snack: chips Beverages: carbonated water, water, a pot of coffee to 0 cups coffee   NUTRITION INTERVENTION  Nutrition education (E-1) on the following topics:  Anxiety and the vagal nerve Additives and preservatives and GI tract disturbances  Malnutrition physical presentation  Learning Style & Readiness for Change Teaching method utilized: Visual & Auditory  Demonstrated degree of understanding via: Teach Back  Barriers to learning/adherence to lifestyle change: Anxiety/work stress  Goals Established by Pt Keep a journal of what and how much you are eating along with your symptoms, review this log weekly looking for patterns  Avoid eating fast food For breakfast: try oatmeal or a veggie omelet  Try to eat every 3-5 hours  Get your Personal Pastry Chef to make you some Metamucil muffins  Try one serving of plain Metamucil daily   MONITORING & EVALUATION Dietary intake, weekly physical activity  Next Steps  Patient is to return in 2 months.

## 2023-08-24 ENCOUNTER — Encounter (HOSPITAL_COMMUNITY): Payer: Self-pay

## 2023-08-24 ENCOUNTER — Other Ambulatory Visit: Payer: Self-pay

## 2023-08-24 ENCOUNTER — Emergency Department (HOSPITAL_COMMUNITY)
Admission: EM | Admit: 2023-08-24 | Discharge: 2023-08-24 | Disposition: A | Discharge status: Home / Self Care | Attending: Emergency Medicine | Admitting: Emergency Medicine

## 2023-08-24 ENCOUNTER — Emergency Department (HOSPITAL_COMMUNITY)

## 2023-08-24 DIAGNOSIS — S61412A Laceration without foreign body of left hand, initial encounter: Secondary | ICD-10-CM | POA: Diagnosis not present

## 2023-08-24 DIAGNOSIS — J45909 Unspecified asthma, uncomplicated: Secondary | ICD-10-CM | POA: Diagnosis not present

## 2023-08-24 DIAGNOSIS — S61411A Laceration without foreign body of right hand, initial encounter: Secondary | ICD-10-CM | POA: Insufficient documentation

## 2023-08-24 DIAGNOSIS — S63501A Unspecified sprain of right wrist, initial encounter: Secondary | ICD-10-CM | POA: Diagnosis not present

## 2023-08-24 DIAGNOSIS — Z7951 Long term (current) use of inhaled steroids: Secondary | ICD-10-CM | POA: Insufficient documentation

## 2023-08-24 DIAGNOSIS — S50311A Abrasion of right elbow, initial encounter: Secondary | ICD-10-CM | POA: Diagnosis not present

## 2023-08-24 DIAGNOSIS — S6991XA Unspecified injury of right wrist, hand and finger(s), initial encounter: Secondary | ICD-10-CM | POA: Diagnosis present

## 2023-08-24 DIAGNOSIS — T148XXA Other injury of unspecified body region, initial encounter: Secondary | ICD-10-CM

## 2023-08-24 DIAGNOSIS — Y9355 Activity, bike riding: Secondary | ICD-10-CM | POA: Diagnosis not present

## 2023-08-24 DIAGNOSIS — F17210 Nicotine dependence, cigarettes, uncomplicated: Secondary | ICD-10-CM | POA: Insufficient documentation

## 2023-08-24 MED ORDER — ACETAMINOPHEN 500 MG PO TABS
1000.0000 mg | ORAL_TABLET | Freq: Once | ORAL | Status: AC
  Administered 2023-08-24: 1000 mg via ORAL
  Filled 2023-08-24: qty 2

## 2023-08-24 MED ORDER — IBUPROFEN 800 MG PO TABS
800.0000 mg | ORAL_TABLET | Freq: Once | ORAL | Status: AC
  Administered 2023-08-24: 800 mg via ORAL
  Filled 2023-08-24: qty 1

## 2023-08-24 NOTE — Discharge Instructions (Addendum)
 It was a pleasure caring for you today in the emergency department.  Clean abrasion to your palm twice daily with gentle soap and water, avoid submersion of the wound underwater such as hot tubs/pools/lakes etc, if you wash dishes use thick rubber gloves until the wound is healed.  Please see your PCP in one week for repeat wrist XR  Please return to the emergency department for any worsening or worrisome symptoms.
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
## 2023-08-24 NOTE — ED Notes (Signed)
 X-ray at bedside

## 2023-08-25 ENCOUNTER — Ambulatory Visit: Payer: Self-pay
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
## 2023-08-26 ENCOUNTER — Encounter: Payer: Self-pay | Admitting: Family Medicine

## 2023-08-26 ENCOUNTER — Ambulatory Visit: Admitting: Family Medicine

## 2023-08-26 VITALS — BP 127/86 | HR 71 | Temp 98.4°F | Ht 67.0 in | Wt 137.6 lb

## 2023-08-26 DIAGNOSIS — S60511A Abrasion of right hand, initial encounter: Secondary | ICD-10-CM | POA: Diagnosis not present

## 2023-08-26 DIAGNOSIS — M25531 Pain in right wrist: Secondary | ICD-10-CM | POA: Diagnosis not present

## 2023-08-26 MED ORDER — DICLOFENAC SODIUM 75 MG PO TBEC: 75.0000 mg | DELAYED_RELEASE_TABLET | Freq: Two times a day (BID) | ORAL | 0 refills | Status: DC

## 2023-08-26 MED ORDER — DOXYCYCLINE HYCLATE 100 MG PO TABS: 100.0000 mg | ORAL_TABLET | Freq: Two times a day (BID) | ORAL | 0 refills | Status: DC
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
## 2023-08-26 NOTE — Patient Instructions (Signed)
 It was very nice to see you today!  We debrided your wound today.  Please keep the area clean and dry.  This should heal up over the next couple of weeks.  Let us  know if you have any issues.  Start the doxycycline for the next week.  Your wrist sprain should continue to improve.  Take the diclofenac.  Let us  know if not improving.  Return if symptoms worsen or fail to improve.   Take care, Dr Daneil Dunker  PLEASE NOTE:  If you had any lab tests, please let us  know if you have not heard back within a few days. You may see your results on mychart before we have a chance to review them but we will give you a call once they are reviewed by us .   If we ordered any referrals today, please let us  know if you have not heard from their office within the next week.   If you had any urgent prescriptions sent in today, please check with the pharmacy within an hour of our visit to make sure the prescription was transmitted appropriately.   Please try these tips to maintain a healthy lifestyle:  Eat at least 3 REAL meals and 1-2 snacks per day.  Aim for no more than 5 hours between eating.  If you eat breakfast, please do so within one hour of getting up.   Each meal should contain half fruits/vegetables, one quarter protein, and one quarter carbs (no bigger than a computer mouse)  Cut down on sweet beverages. This includes juice, soda, and sweet tea.   Drink at least 1 glass of water with each meal and aim for at least 8 glasses per day  Exercise at least 150 minutes every week.

## 2023-08-26 NOTE — Telephone Encounter (Signed)
 Noted, patient has an OV today with PCP

## 2023-09-01 ENCOUNTER — Encounter: Payer: Self-pay | Admitting: Skilled Nursing Facility1

## 2023-09-01 ENCOUNTER — Encounter: Attending: Family Medicine | Admitting: Skilled Nursing Facility1

## 2023-09-01 DIAGNOSIS — R195 Other fecal abnormalities: Secondary | ICD-10-CM | POA: Diagnosis present

## 2023-09-01 NOTE — Progress Notes (Signed)
 Medical Nutrition Therapy  Appointment Start time:  2:45  Appointment End time:  3:00  Primary concerns today: Multiple loose stools a day  Referral diagnosis: r19.5  NUTRITION ASSESSMENT    Clinical Medical Hx: ADHD Medications: see list Labs: Notable Signs/Symptoms: Often tired   Lifestyle & Dietary Hx  Pt states he did stop eating fast food which has caused a significantly improved GI system. Pt states he does now have 1 lose bowel movement a day with significantly less pain. Pt states he did gain about 3 pounds. 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   Pt states he Wrote a program for his foods that affect him finding coffee and dairy affect him negatively so he cut out coffee and limits dairy.   Body Composition Scale 07/07/2023  Current Body Weight 136.1  Total Body Fat % 7.5  Visceral Fat 5  Fat-Free Mass % 92.5   Total Body Water % 73  Muscle-Mass lbs 26.1  BMI 21  Body Fat Displacement          Torso  lbs 6.2         Left Leg  lbs 1.2         Right Leg  lbs 1.2         Left Arm  lbs .6         Right Arm   lbs .6    Estimated daily fluid intake:  oz Supplements: N/A Sleep:  Stress / self-care: high stress level Current average weekly physical activity: ADL's; walking at work   24-Hr Dietary Recall: occasionally boost throughout the week; up at 5am  First Meal 6am: homemade pastry from his girlfriend  Snack 8am: eggs + hash brown + grits Second Meal: (all homade) tacos or pizza  Snack:  Third Meal: homemade meals  Snack: chips Beverages: carbonated water, water, a pot of coffee to 0 cups coffee   NUTRITION INTERVENTION  Nutrition education (E-1) on the following topics: Previous Education  Anxiety and the vagal nerve Additives and preservatives and GI tract disturbances  Malnutrition physical presentation  Learning Style & Readiness for Change Teaching method utilized: Visual & Auditory  Demonstrated  degree of understanding via: Teach Back  Barriers to learning/adherence to lifestyle change: Anxiety/work stress  Goals Established by Pt Continue avoidance of coffee and fast food   MONITORING & EVALUATION Dietary intake, weekly physical activity  Next Steps  Patient is to call or email with any future questions or concerns

## 2023-09-03 ENCOUNTER — Encounter: Payer: 59 | Admitting: Family Medicine

## 2023-10-14 ENCOUNTER — Ambulatory Visit: Payer: Self-pay
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
## 2023-10-14 NOTE — Telephone Encounter (Signed)
 Appt tomorrow.
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
## 2023-10-15 ENCOUNTER — Ambulatory Visit (INDEPENDENT_AMBULATORY_CARE_PROVIDER_SITE_OTHER): Admitting: Family Medicine

## 2023-10-15 ENCOUNTER — Encounter: Payer: Self-pay | Admitting: Family Medicine

## 2023-10-15 VITALS — BP 90/60 | HR 84 | Temp 98.5°F | Ht 67.0 in | Wt 139.2 lb

## 2023-10-15 DIAGNOSIS — M79641 Pain in right hand: Secondary | ICD-10-CM | POA: Diagnosis not present

## 2023-10-15 DIAGNOSIS — S63591A Other specified sprain of right wrist, initial encounter: Secondary | ICD-10-CM

## 2023-10-15 DIAGNOSIS — M25631 Stiffness of right wrist, not elsewhere classified: Secondary | ICD-10-CM

## 2023-10-15 DIAGNOSIS — R29898 Other symptoms and signs involving the musculoskeletal system: Secondary | ICD-10-CM

## 2023-10-15 DIAGNOSIS — M25531 Pain in right wrist: Secondary | ICD-10-CM

## 2023-10-15 DIAGNOSIS — S52501G Unspecified fracture of the lower end of right radius, subsequent encounter for closed fracture with delayed healing: Secondary | ICD-10-CM

## 2023-10-15 MED ORDER — PREDNISONE 20 MG PO TABS: ORAL_TABLET | ORAL | 0 refills | Status: DC

## 2023-10-30 ENCOUNTER — Ambulatory Visit
Admission: RE | Admit: 2023-10-30 | Discharge: 2023-10-30 | Disposition: A | Discharge status: Home / Self Care | Source: Ambulatory Visit | Attending: Family Medicine | Admitting: Family Medicine

## 2023-10-30 DIAGNOSIS — R29898 Other symptoms and signs involving the musculoskeletal system: Secondary | ICD-10-CM

## 2023-10-30 DIAGNOSIS — M25631 Stiffness of right wrist, not elsewhere classified: Secondary | ICD-10-CM

## 2023-10-30 DIAGNOSIS — M79641 Pain in right hand: Secondary | ICD-10-CM

## 2023-10-30 MED ORDER — IOPAMIDOL (ISOVUE-M 200) INJECTION 41%
1.5000 mL | Freq: Once | INTRAMUSCULAR | Status: AC
  Administered 2023-10-30: 1.5 mL via INTRA_ARTICULAR

## 2023-11-03 ENCOUNTER — Ambulatory Visit: Payer: Self-pay | Admitting: Family Medicine

## 2023-11-03 NOTE — Addendum Note (Signed)
 Addended by: WATT MIRZA on: 11/03/2023 05:45 PM   Modules accepted: Orders

## 2023-12-03 ENCOUNTER — Encounter: Payer: Self-pay | Admitting: Family

## 2023-12-03 ENCOUNTER — Ambulatory Visit (INDEPENDENT_AMBULATORY_CARE_PROVIDER_SITE_OTHER): Admitting: Family

## 2023-12-03 VITALS — BP 123/79 | HR 101 | Temp 100.4°F | Ht 67.0 in | Wt 143.2 lb

## 2023-12-03 DIAGNOSIS — R509 Fever, unspecified: Secondary | ICD-10-CM | POA: Diagnosis not present

## 2023-12-03 DIAGNOSIS — R051 Acute cough: Secondary | ICD-10-CM | POA: Diagnosis not present

## 2023-12-03 LAB — POCT RAPID STREP A (OFFICE): Rapid Strep A Screen: NEGATIVE

## 2023-12-03 LAB — POCT INFLUENZA A/B
Influenza A, POC: NEGATIVE
Influenza B, POC: NEGATIVE

## 2023-12-03 LAB — POC COVID19 BINAXNOW: SARS Coronavirus 2 Ag: NEGATIVE

## 2023-12-03 NOTE — Progress Notes (Signed)
 Patient ID: Kevin Zimmerman, male    DOB: 2000-04-22, 23 y.o.   MRN: [id number]  Chief Complaint  Patient presents with   Cough    Cough, congestion and sore throat  Discussed the use of AI scribe software for clinical note transcription with the patient, who gave verbal consent to proceed.  History of Present Illness Kevin Zimmerman is a 23 year old male who presents with a sore throat, fatigue, and body aches.  Pharyngitis and associated symptoms - Severe sore throat onset nearly one week ago, now improved - Uses salt water gargles and mouthwash for sore throat - No ear pain - No chills  Constitutional symptoms - Fatigue and body aches are primary symptoms, both improving - Ibuprofen  provides symptom relief; symptoms worsen when medication wears off - Feels warm but has not checked temperature  Functional impact - Missed work today - Worked half days on Monday and Tuesday due to symptoms  Epidemiological exposure - Grandmother experienced similar symptoms - Recent travel to [place], became ill shortly after return  Assessment & Plan Acute viral upper respiratory infection Symptoms for 6 days include sore throat, fatigue, body aches, and fever. Negative for flu, COVID, and strep today. Likely viral etiology. - Continue ibuprofen , up to three pills at a time, three times a day, with food. - Encourage salt water gargles for sore throat relief. - Advise monitoring fever at home and report if fever persists >100 or symptoms worsen. - Encourage fluid intake at least 2L water or caffeine drinks daily - Advise against returning to work until fever resolves. - Provide a doctor's note for work absence returning Friday. - Call back next week if symptoms are still not improved.   Subjective:    Outpatient Medications Prior to Visit  Medication Sig Dispense Refill   albuterol  (PROVENTIL  HFA;VENTOLIN  HFA) 108 (90 Base) MCG/ACT inhaler Inhale 2 puffs into the lungs every 6 (six)  hours as needed for wheezing. 1 Inhaler 1   EPINEPHrine  0.3 mg/0.3 mL IJ SOAJ injection Inject 0.3 mLs (0.3 mg total) into the muscle once. 1 Device 2   fluticasone -salmeterol (ADVAIR HFA) 115-21 MCG/ACT inhaler Inhale 2 puffs into the lungs every morning. 1 Inhaler 1   ibuprofen  (ADVIL ,MOTRIN ) 200 MG tablet Take 200 mg by mouth every 6 (six) hours as needed for pain. Reported on 08/29/2015 (Patient not taking: Reported on 12/03/2023)     predniSONE  (DELTASONE ) 20 MG tablet 2 tabs po daily for 5 days, then 1 tab po daily for 5 days (Patient not taking: Reported on 12/03/2023) 15 tablet 0   No facility-administered medications prior to visit.   Past Medical History:  Diagnosis Date   ADHD (attention deficit hyperactivity disorder)    Asthma    Colon polyps    Seasonal allergies    Past Surgical History:  Procedure Laterality Date   COLONOSCOPY     COLONOSCOPY N/A 10/09/2012   Procedure: COLONOSCOPY;  Surgeon: Fairy VEAR Gaskins, MD;  [facility];  Service: Gastroenterology;  Laterality: N/A;   Allergies  Allergen Reactions   Food Anaphylaxis    Tested positive to peanuts years ago but tolerates peanuts well at present. Had an anaphylactic reaction to micro-protein Jocelyne) about one year ago that required treatment with steroids and overnight hospitalization.   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  Constitutional:      General: He is  not in acute distress.    Appearance: Normal appearance.  HENT:     Head: Normocephalic.     Right Ear: Tympanic membrane and ear canal normal.     Left Ear: Tympanic membrane and ear canal normal.     Nose:     Right Sinus: No maxillary sinus tenderness or frontal sinus tenderness.     Left Sinus: No maxillary sinus tenderness or frontal sinus tenderness.     Mouth/Throat:     Mouth: Mucous membranes are moist.     Pharynx: No pharyngeal swelling,  oropharyngeal exudate, posterior oropharyngeal erythema or uvula swelling.     Tonsils: No tonsillar exudate or tonsillar abscesses.  Cardiovascular:     Rate and Rhythm: Normal rate and regular rhythm.  Pulmonary:     Effort: Pulmonary effort is normal.     Breath sounds: Normal breath sounds.  Musculoskeletal:        General: Normal range of motion.     Cervical back: Normal range of motion.  Lymphadenopathy:     Head:     Right side of head: No preauricular or posterior auricular adenopathy.     Left side of head: No preauricular or posterior auricular adenopathy.     Cervical: No cervical adenopathy.  Skin:    General: Skin is warm and dry.  Neurological:     Mental Status: He is alert and oriented to person, place, and time.  Psychiatric:        Mood and Affect: Mood normal.    BP 123/79   Pulse (!) 101   Temp (!) 100.4 F (38 C)   Ht 5' 7 (1.702 m)   Wt 143 lb 3.2 oz (65 kg)   SpO2 95%   BMI 22.43 kg/m  Wt Readings from Last 3 Encounters:  12/03/23 143 lb 3.2 oz (65 kg)  10/15/23 139 lb 4 oz (63.2 kg)  08/26/23 137 lb 9.6 oz (62.4 kg)       Lucius Krabbe, NP

## 2023-12-08 ENCOUNTER — Other Ambulatory Visit (INDEPENDENT_AMBULATORY_CARE_PROVIDER_SITE_OTHER)

## 2023-12-08 ENCOUNTER — Ambulatory Visit (INDEPENDENT_AMBULATORY_CARE_PROVIDER_SITE_OTHER): Admitting: Orthopedic Surgery

## 2023-12-08 DIAGNOSIS — M25531 Pain in right wrist: Secondary | ICD-10-CM | POA: Diagnosis not present

## 2023-12-08 DIAGNOSIS — M25831 Other specified joint disorders, right wrist: Secondary | ICD-10-CM

## 2023-12-08 DIAGNOSIS — S52551A Other extraarticular fracture of lower end of right radius, initial encounter for closed fracture: Secondary | ICD-10-CM | POA: Diagnosis not present

## 2023-12-08 DIAGNOSIS — S63591A Other specified sprain of right wrist, initial encounter: Secondary | ICD-10-CM | POA: Diagnosis not present
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
## 2023-12-26 ENCOUNTER — Other Ambulatory Visit: Payer: Self-pay

## 2023-12-26 ENCOUNTER — Encounter (HOSPITAL_BASED_OUTPATIENT_CLINIC_OR_DEPARTMENT_OTHER): Payer: Self-pay | Admitting: Orthopedic Surgery

## 2024-01-01 ENCOUNTER — Other Ambulatory Visit: Payer: Self-pay

## 2024-01-01 DIAGNOSIS — S63591A Other specified sprain of right wrist, initial encounter: Secondary | ICD-10-CM

## 2024-01-02 ENCOUNTER — Ambulatory Visit (HOSPITAL_BASED_OUTPATIENT_CLINIC_OR_DEPARTMENT_OTHER)
Admission: RE | Admit: 2024-01-02 | Discharge: 2024-01-02 | Disposition: A | Discharge status: Home / Self Care | Attending: Orthopedic Surgery | Admitting: Orthopedic Surgery

## 2024-01-02 ENCOUNTER — Ambulatory Visit (HOSPITAL_BASED_OUTPATIENT_CLINIC_OR_DEPARTMENT_OTHER): Admitting: Anesthesiology

## 2024-01-02 ENCOUNTER — Encounter (HOSPITAL_BASED_OUTPATIENT_CLINIC_OR_DEPARTMENT_OTHER)
Admission: RE | Disposition: A | Discharge status: Home / Self Care | Payer: Self-pay | Source: Home / Self Care | Attending: Orthopedic Surgery

## 2024-01-02 ENCOUNTER — Encounter (HOSPITAL_BASED_OUTPATIENT_CLINIC_OR_DEPARTMENT_OTHER): Payer: Self-pay | Admitting: Orthopedic Surgery

## 2024-01-02 ENCOUNTER — Ambulatory Visit (HOSPITAL_BASED_OUTPATIENT_CLINIC_OR_DEPARTMENT_OTHER)

## 2024-01-02 ENCOUNTER — Other Ambulatory Visit: Payer: Self-pay

## 2024-01-02 DIAGNOSIS — M25831 Other specified joint disorders, right wrist: Secondary | ICD-10-CM

## 2024-01-02 DIAGNOSIS — J45909 Unspecified asthma, uncomplicated: Secondary | ICD-10-CM | POA: Insufficient documentation

## 2024-01-02 DIAGNOSIS — X58XXXA Exposure to other specified factors, initial encounter: Secondary | ICD-10-CM | POA: Diagnosis not present

## 2024-01-02 DIAGNOSIS — F17210 Nicotine dependence, cigarettes, uncomplicated: Secondary | ICD-10-CM | POA: Diagnosis not present

## 2024-01-02 DIAGNOSIS — M25531 Pain in right wrist: Secondary | ICD-10-CM

## 2024-01-02 DIAGNOSIS — M25331 Other instability, right wrist: Secondary | ICD-10-CM

## 2024-01-02 DIAGNOSIS — S63591A Other specified sprain of right wrist, initial encounter: Secondary | ICD-10-CM | POA: Insufficient documentation

## 2024-01-02 DIAGNOSIS — S52551A Other extraarticular fracture of lower end of right radius, initial encounter for closed fracture: Secondary | ICD-10-CM | POA: Diagnosis not present

## 2024-01-02 HISTORY — DX: Anxiety disorder, unspecified: F41.9

## 2024-01-02 SURGERY — ARTHROSCOPY, WRIST WITH DEBRIDEMENT
Anesthesia: Monitor Anesthesia Care | Site: Wrist | Laterality: Right

## 2024-01-02 MED ORDER — MIDAZOLAM HCL 2 MG/2ML IJ SOLN
INTRAMUSCULAR | Status: AC
  Filled 2024-01-02: qty 2

## 2024-01-02 MED ORDER — PROPOFOL 500 MG/50ML IV EMUL
INTRAVENOUS | Status: AC
  Filled 2024-01-02: qty 50

## 2024-01-02 MED ORDER — FENTANYL CITRATE (PF) 100 MCG/2ML IJ SOLN
INTRAMUSCULAR | Status: AC
  Filled 2024-01-02: qty 2

## 2024-01-02 MED ORDER — ROPIVACAINE HCL 5 MG/ML IJ SOLN
INTRAMUSCULAR | Status: DC | PRN
  Administered 2024-01-02: 30 mL via PERINEURAL

## 2024-01-02 MED ORDER — DROPERIDOL 2.5 MG/ML IJ SOLN: 0.6250 mg | Freq: Once | INTRAMUSCULAR | Status: DC | PRN

## 2024-01-02 MED ORDER — DEXMEDETOMIDINE HCL IN NACL 80 MCG/20ML IV SOLN
INTRAVENOUS | Status: DC | PRN
  Administered 2024-01-02: 4 ug via INTRAVENOUS
  Administered 2024-01-02: 8 ug via INTRAVENOUS

## 2024-01-02 MED ORDER — VANCOMYCIN HCL IN DEXTROSE 1-5 GM/200ML-% IV SOLN
1000.0000 mg | Freq: Once | INTRAVENOUS | Status: AC
  Administered 2024-01-02: 1000 mg via INTRAVENOUS

## 2024-01-02 MED ORDER — SUGAMMADEX SODIUM 200 MG/2ML IV SOLN
INTRAVENOUS | Status: DC | PRN
  Administered 2024-01-02: 200 mg via INTRAVENOUS

## 2024-01-02 MED ORDER — 0.9 % SODIUM CHLORIDE (POUR BTL) OPTIME
TOPICAL | Status: DC | PRN
  Administered 2024-01-02: 1000 mL

## 2024-01-02 MED ORDER — LACTATED RINGERS IV SOLN: INTRAVENOUS | Status: DC

## 2024-01-02 MED ORDER — OXYCODONE HCL 5 MG PO TABS: 5.0000 mg | ORAL_TABLET | Freq: Four times a day (QID) | ORAL | 0 refills | Status: AC | PRN

## 2024-01-02 MED ORDER — LIDOCAINE 2% (20 MG/ML) 5 ML SYRINGE
INTRAMUSCULAR | Status: AC
  Filled 2024-01-02: qty 5

## 2024-01-02 MED ORDER — FENTANYL CITRATE (PF) 100 MCG/2ML IJ SOLN: 25.0000 ug | INTRAMUSCULAR | Status: DC | PRN

## 2024-01-02 MED ORDER — PROPOFOL 10 MG/ML IV BOLUS
INTRAVENOUS | Status: DC | PRN
  Administered 2024-01-02: 20 mg via INTRAVENOUS
  Administered 2024-01-02: 100 mg via INTRAVENOUS
  Administered 2024-01-02: 20 mg via INTRAVENOUS
  Administered 2024-01-02: 200 mg via INTRAVENOUS

## 2024-01-02 MED ORDER — PROPOFOL 500 MG/50ML IV EMUL
INTRAVENOUS | Status: DC | PRN
  Administered 2024-01-02: 75 ug/kg/min via INTRAVENOUS

## 2024-01-02 MED ORDER — ROCURONIUM BROMIDE 10 MG/ML (PF) SYRINGE
PREFILLED_SYRINGE | INTRAVENOUS | Status: AC
Start: 2024-01-02 — End: 2024-01-02
  Filled 2024-01-02: qty 10

## 2024-01-02 MED ORDER — ONDANSETRON HCL 4 MG/2ML IJ SOLN
INTRAMUSCULAR | Status: DC | PRN
  Administered 2024-01-02: 4 mg via INTRAVENOUS

## 2024-01-02 MED ORDER — ROCURONIUM BROMIDE 100 MG/10ML IV SOLN
INTRAVENOUS | Status: DC | PRN
  Administered 2024-01-02: 70 mg via INTRAVENOUS

## 2024-01-02 MED ORDER — OXYCODONE HCL 5 MG/5ML PO SOLN: 5.0000 mg | Freq: Once | ORAL | Status: DC | PRN

## 2024-01-02 MED ORDER — ONDANSETRON HCL 4 MG/2ML IJ SOLN
INTRAMUSCULAR | Status: AC
  Filled 2024-01-02: qty 2

## 2024-01-02 MED ORDER — MIDAZOLAM HCL 2 MG/2ML IJ SOLN
4.0000 mg | Freq: Once | INTRAMUSCULAR | Status: AC
  Administered 2024-01-02: 4 mg via INTRAVENOUS

## 2024-01-02 MED ORDER — LIDOCAINE HCL (CARDIAC) PF 100 MG/5ML IV SOSY
PREFILLED_SYRINGE | INTRAVENOUS | Status: DC | PRN
  Administered 2024-01-02: 40 mg via INTRAVENOUS
  Administered 2024-01-02: 60 mg via INTRAVENOUS

## 2024-01-02 MED ORDER — MIDAZOLAM HCL 5 MG/5ML IJ SOLN
INTRAMUSCULAR | Status: DC | PRN
  Administered 2024-01-02: 2 mg via INTRAVENOUS

## 2024-01-02 MED ORDER — EPHEDRINE SULFATE (PRESSORS) 50 MG/ML IJ SOLN
INTRAMUSCULAR | Status: DC | PRN
  Administered 2024-01-02: 10 mg via INTRAVENOUS

## 2024-01-02 MED ORDER — EPHEDRINE 5 MG/ML INJ
INTRAVENOUS | Status: AC
  Filled 2024-01-02: qty 5

## 2024-01-02 MED ORDER — OXYCODONE HCL 5 MG PO TABS: 5.0000 mg | ORAL_TABLET | Freq: Once | ORAL | Status: DC | PRN

## 2024-01-02 MED ORDER — DEXAMETHASONE SODIUM PHOSPHATE 4 MG/ML IJ SOLN
INTRAMUSCULAR | Status: DC | PRN
  Administered 2024-01-02: 5 mg via INTRAVENOUS

## 2024-01-02 MED ORDER — VANCOMYCIN HCL IN DEXTROSE 1-5 GM/200ML-% IV SOLN
INTRAVENOUS | Status: AC
  Filled 2024-01-02: qty 200

## 2024-01-02 MED ORDER — ONDANSETRON HCL 4 MG/2ML IJ SOLN: 4.0000 mg | Freq: Once | INTRAMUSCULAR | Status: DC | PRN

## 2024-01-02 MED ORDER — DEXMEDETOMIDINE HCL IN NACL 80 MCG/20ML IV SOLN
INTRAVENOUS | Status: AC
  Filled 2024-01-02: qty 20

## 2024-01-02 MED ORDER — FENTANYL CITRATE (PF) 100 MCG/2ML IJ SOLN
100.0000 ug | Freq: Once | INTRAMUSCULAR | Status: AC
  Administered 2024-01-02: 100 ug via INTRAVENOUS

## 2024-01-02 SURGICAL SUPPLY — 72 items
2.3 drill IMPLANT
BLADE MINI RND TIP GREEN BEAV (BLADE) IMPLANT
BLADE SAW 9 STYPE (MISCELLANEOUS) IMPLANT
BLADE SURG 11 STRL SS (BLADE) ×1 IMPLANT
BLADE SURG 15 STRL LF DISP TIS (BLADE) IMPLANT
BNDG COMPR ESMARK 4X3 LF (GAUZE/BANDAGES/DRESSINGS) ×1 IMPLANT
BNDG ELASTIC 4INX 5YD STR LF (GAUZE/BANDAGES/DRESSINGS) ×2 IMPLANT
BNDG GAUZE DERMACEA FLUFF 4 (GAUZE/BANDAGES/DRESSINGS) ×1 IMPLANT
CHLORAPREP W/TINT 26 (MISCELLANEOUS) ×1 IMPLANT
CORD BIPOLAR FORCEPS 12FT (ELECTRODE) IMPLANT
COVER BACK TABLE 60X90IN (DRAPES) ×1 IMPLANT
CUFF TOURN SGL QUICK 18X4 (TOURNIQUET CUFF) ×1 IMPLANT
DRAPE HAND 77X146 (DRAPES) ×1 IMPLANT
DRAPE OEC MINIVIEW 54X84 (DRAPES) IMPLANT
DRAPE SURG 17X23 STRL (DRAPES) ×1 IMPLANT
GAUZE PAD ABD 8X10 STRL (GAUZE/BANDAGES/DRESSINGS) ×1 IMPLANT
GAUZE SPONGE 4X4 12PLY STRL (GAUZE/BANDAGES/DRESSINGS) ×1 IMPLANT
GAUZE STRETCH 2X75IN STRL (MISCELLANEOUS) ×1 IMPLANT
GAUZE XEROFORM 1X8 LF (GAUZE/BANDAGES/DRESSINGS) ×1 IMPLANT
GLOVE BIO SURGEON STRL SZ7.5 (GLOVE) ×2 IMPLANT
GLOVE BIOGEL PI IND STRL 7.5 (GLOVE) ×1 IMPLANT
GOWN STRL REUS W/ TWL LRG LVL3 (GOWN DISPOSABLE) ×1 IMPLANT
GOWN STRL REUS W/TWL XL LVL3 (GOWN DISPOSABLE) IMPLANT
GOWN STRL SURGICAL XL XLNG (GOWN DISPOSABLE) ×1 IMPLANT
IV SET EXT 30 76VOL 4 MALE LL (IV SETS) IMPLANT
KWIRE 1.6X65 (WIRE) IMPLANT
KWIRE FX100X1.6XNS KRSH (WIRE) IMPLANT
MANIFOLD NEPTUNE II (INSTRUMENTS) ×1 IMPLANT
NDL HYPO 25X1 1.5 SAFETY (NEEDLE) ×1 IMPLANT
NDL HYPO 25X5/8 SAFETYGLIDE (NEEDLE) IMPLANT
NDL SAFETY ECLIPSE 18X1.5 (NEEDLE) ×1 IMPLANT
NDL SPNL 18GX3.5 QUINCKE PK (NEEDLE) IMPLANT
NEEDLE HYPO 25X1 1.5 SAFETY (NEEDLE) ×1 IMPLANT
NEEDLE HYPO 25X5/8 SAFETYGLIDE (NEEDLE) IMPLANT
NEEDLE SPNL 18GX3.5 QUINCKE PK (NEEDLE) ×1 IMPLANT
NS IRRIG 1000ML POUR BTL (IV SOLUTION) IMPLANT
PACK BASIN DAY SURGERY FS (CUSTOM PROCEDURE TRAY) ×1 IMPLANT
PAD CAST 3X4 CTTN HI CHSV (CAST SUPPLIES) IMPLANT
PLATE ULNAR SHORTENING (Plate) IMPLANT
SCREW CORT 3.2X18 HEX (Screw) IMPLANT
SCREW CORT THRD HEAD 3.2X10 (Screw) IMPLANT
SCREW HEX 3.2X12 (Screw) IMPLANT
SCREW HEX 3.2X14 (Screw) IMPLANT
SHAVER DISSECTOR 3.0 (BURR) ×1 IMPLANT
SHAVER SABRE 2.0 (BURR) IMPLANT
SHEET MEDIUM DRAPE 40X70 STRL (DRAPES) ×1 IMPLANT
SLEEVE SCD COMPRESS KNEE MED (STOCKING) ×1 IMPLANT
SLING ARM FOAM STRAP LRG (SOFTGOODS) IMPLANT
SPIKE FLUID TRANSFER (MISCELLANEOUS) IMPLANT
SPLINT PLASTER CAST FAST 5X30 (CAST SUPPLIES) IMPLANT
SPLINT PLASTER CAST XFAST 4X15 (CAST SUPPLIES) ×1 IMPLANT
SPONGE SURGIFOAM ABS GEL 12-7 (HEMOSTASIS) IMPLANT
STOCKINETTE IMPERVIOUS 9X36 MD (GAUZE/BANDAGES/DRESSINGS) ×1 IMPLANT
STRAP SET WRIST TOWER ACUMED (MISCELLANEOUS) ×1 IMPLANT
SUCTION TUBE FRAZIER 10FR DISP (SUCTIONS) IMPLANT
SUT CHROMIC 3 0 SH 27 (SUTURE) IMPLANT
SUT ETHILON 4 0 PS 2 18 (SUTURE) ×1 IMPLANT
SUT MNCRL AB 3-0 PS2 27 (SUTURE) IMPLANT
SUT MNCRL AB 4-0 PS2 18 (SUTURE) IMPLANT
SUT PDS AB 4-0 RB1 27 (SUTURE) IMPLANT
SUT SILK 4 0 PS 2 (SUTURE) IMPLANT
SUT VIC AB 3-0 FS2 27 (SUTURE) IMPLANT
SUT VIC AB 3-0 PS2 18XBRD (SUTURE) IMPLANT
SUT VIC AB 3-0 SH 27XBRD (SUTURE) IMPLANT
SUT VIC AB 4-0 PS2 18 (SUTURE) IMPLANT
SYR BULB EAR ULCER 3OZ GRN STR (SYRINGE) IMPLANT
SYR CONTROL 10ML LL (SYRINGE) ×1 IMPLANT
TOWEL GREEN STERILE FF (TOWEL DISPOSABLE) ×2 IMPLANT
TUBE CONNECTING 20X1/4 (TUBING) IMPLANT
TUBING ARTHROSCOPY IRRIG 16FT (MISCELLANEOUS) ×1 IMPLANT
UNDERPAD 30X36 HEAVY ABSORB (UNDERPADS AND DIAPERS) ×1 IMPLANT
WAND 1.5 MICROBLATOR (SURGICAL WAND) IMPLANT

## 2024-01-02 NOTE — Interval H&P Note (Signed)
 History and Physical Interval Note:  01/02/2024 9:32 AM  Kevin Zimmerman  has presented today for surgery, with the diagnosis of RIGHT WRIST TRIANGULAR FIBROCARTILAGE COXPLEX TEAR, ULNAR IMPACTION SYNDROME.  The various methods of treatment have been discussed with the patient and family. After consideration of risks, benefits and other options for treatment, the patient has consented to  Procedure(s) with comments: ARTHROSCOPY, WRIST WITH DEBRIDEMENT (Right) - RIGHT WRIST ARTHROSCOPY WITH TRIANGULAR FIBROCARTILAGE COMPLEX REPAIR VS DEBRIDEMENT / ULNAR SHORTENING OSTEOTOMY SHORTENING, ULNA (Right) as a surgical intervention.  The patient's history has been reviewed, patient examined, no change in status, stable for surgery.  I have reviewed the patient's chart and labs.  Questions were answered to the patient's satisfaction.     Ilir Mahrt

## 2024-01-02 NOTE — Transfer of Care (Signed)
 Immediate Anesthesia Transfer of Care Note  Patient: Kevin Zimmerman  Procedure(s) Performed: ARTHROSCOPY, WRIST WITH DEBRIDEMENT (Right: Wrist) SHORTENING, ULNA (Right: Wrist)  Patient Location: PACU  Anesthesia Type:General  Level of Consciousness: awake  Airway & Oxygen Therapy: Patient Spontanous Breathing and Patient connected to nasal cannula oxygen  Post-op Assessment: Report given to RN  Post vital signs: Reviewed and stable  Last Vitals:  Vitals Value Taken Time  BP 134/72 01/02/24 15:15  Temp    Pulse 105 01/02/24 15:18  Resp 16 01/02/24 15:18  SpO2 97 % 01/02/24 15:18  Vitals shown include unfiled device data.  Last Pain:  Vitals:   01/02/24 0907  TempSrc: Temporal  PainSc: 0-No pain         Complications: No notable events documented.

## 2024-01-02 NOTE — Op Note (Signed)
 NAME: Kevin Zimmerman MEDICAL RECORD NO: [id number] DATE OF BIRTH: [birth year] FACILITY: [facility] LOCATION: [facility] PHYSICIAN: GILDARDO ALDERTON, MD   OPERATIVE REPORT   DATE OF PROCEDURE: 01/02/24    PREOPERATIVE DIAGNOSIS: Right wrist ulnar-sided wrist pain, TFCC tear ulnar positivity with impaction syndrome   POSTOPERATIVE DIAGNOSIS: Right wrist ulnar-sided wrist pain, TFCC tear, ulnar positivity with impaction syndrome   PROCEDURE: Right wrist arthroscopy with TFCC debridement Right wrist ulnar shortening osteotomy Right wrist capsulorrhaphy for carpal instability   SURGEON:  GILDARDO ALDERTON, M.D.   ASSISTANT: Almeda Rummer, PA   ANESTHESIA:  General with regional   INTRAVENOUS FLUIDS:  Per anesthesia flow sheet.   ESTIMATED BLOOD LOSS:  Minimal.   COMPLICATIONS:  None.   SPECIMENS:  none   TOURNIQUET TIME: 74 minutes  DISPOSITION:  Stable to PACU.    INDICATIONS: 23 year old male with ongoing ulnar-sided wrist pain after a previous distal radius fracture treated nonoperatively.  He has had persistent ulnar-sided wrist pain refractory to conservative treatment.  He underwent MRI workup which showed a TFCC tear of the central articular disc with radial attachment.  There was also concern for ongoing carpal instability based on examination particularly of the ulnar sided ligaments.  Patient was also found to have ulnar impaction syndrome with ongoing ulnar positivity which likely precluded his ability to heal the underlying TFCC tear nonoperatively.  Patient is indicated for left wrist arthroscopy, TFCC repair versus debridement, possible capsulorrhaphy, ulnar shortening osteotomy.  Risks and benefits of surgery were discussed including the risks of infection, bleeding, scarring, stiffness, nerve injury, vascular injury, tendon injury, need for subsequent operation, hardware failure, persistent.pain, nonunion, malunion.  He voiced understanding of these risks  and elected to proceed.  OPERATIVE COURSE: Patient was seen and identified in the preoperative area and marked appropriately.  Surgical consent had been signed. Preoperative IV antibiotic prophylaxis was given. He was transferred to the operating room and placed in supine position with the Right Right upper extremity on an arm board.  General anesthesia was induced by the anesthesiologist. A regional block had been performed by anesthesia in preoperative holding.    Right upper extremity was prepped and draped in normal sterile orthopedic fashion.  A surgical pause was performed between the surgeons, anesthesia, and operating room staff and all were in agreement as to the patient, procedure, and site of procedure.  Tourniquet was placed and padded appropriately to the right upper arm.  We first began with wrist arthroscopy.  The arm was exsanguinated and the tourniquet was inflated to 250 mmHg.  Standard 3-4, 6R and 16 portals were utilized.  Diagnostic arthroscopy was performed.  Radius scaphoid capitate ligament, long and short radiolunate ligaments and scapholunate ligament were all noted to be intact.  There was some synovitis along the radial aspect of the wrist likely from the prior distal radius fracture.  Attention was turned to the ulnar aspect of the wrist.  A central tear of the triangular fibrocartilage complex was confirmed.  Dannielle was inserted to debride the central tear and formed smooth edges.  Probe was inserted, the attachment points of the TFCC were noted to be intact, the peripheral vascular portion of the TFCC was not disrupted.  Scapholunate and lunotriquetral ligaments were probed, these were noted to be intact.  There was some notable laxity of the ulnolunate and ulnotriquetral extrinsic ligaments.  Ablator was inserted and a capsulorrhaphy was performed of these ligaments in order to create increased stability.  No significant  wear of the lunate surface was appreciated.  At this  juncture, we turned our attention to the ulnar shortening osteotomy.  The arm was left in the wrist tower, traction was released.  Longitudinal incision was performed over the ulnar aspect of the wrist.  Skin flaps were elevated, care was taken to protect underlying neurovascular structures.  The interval between the ECU and FCU tendons was utilized to expose the underlying ulnar.  Bone was exposed, periosteum was elevated.  TriMed ulnar shortening plate was then selected.  Plate was found to be fitting to bone volarly, this was temporarily clamped in place.  Biplanar fluoroscopy was utilized to confirm appropriate plate placement within the distal ulnar shaft.  Plate was then secured proximally and distally.  The cutting jig was utilized to shorten the ulna 4 mm as planned preoperatively.  Oblique osteotomy cut was performed, partially-threaded screw was then inserted via the plate guide to create compression across the osteotomy site.  Excellent stability was noted.  Plate was well secured proximally and distally with bicortical nonlocking screws.  Biplanar fluoroscopy was utilized to confirm appropriate plate placement as well as ulnar shortening.  Ulnar positivity had been corrected to neutral variance.  The tourniquet was deflated at 74 minutes.  Fingertips were pink with brisk capillary refill after deflation of tourniquet.  Copious irrigation was performed at the arthroscopy sites as well as the ulnar shortening osteotomy site.  Ulnar incision was closed in layers utilizing 3-0 Vicryl for the periosteal layer to create appropriate coverage over the ulnar plate.  Subcutaneous and skin layers were closed utilizing 3-0 and 4-0 Monocryl.  Dermabond was applied.  Arthroscopic portals were closed utilizing 4-0 nylon in horizontal mattress fashion.  Sterile dressings were applied followed by application of a sugar-tong splint.  Sling was also applied.  The operative drapes were broken down.  The patient was  awoken from anesthesia safely and taken to PACU in stable condition.   Post-operative plan: The patient will recover in the post-anesthesia care unit and then be discharged home.  The patient will be non weight bearing on the right upper extremity in a sugar-tong splint.   I will see the patient back in the office in 2 weeks for postoperative followup.  Discharge instructions were provided for appropriate pain control and dressing maintenance.  A surgical assistant was utilized throughout this procedure.  Their presence was helpful in protecting critical neurovascular structures and during the critical portions of the procedure.  The assistant was also critical in minimizing operative time and patient morbidity.   Tamirra Sienkiewicz, MD Electronically signed, 01/02/24

## 2024-01-02 NOTE — Anesthesia Procedure Notes (Signed)
 Procedure Name: Intubation Date/Time: 01/02/2024 12:43 PM  Performed by: Pam Macario BROCKS, CRNAPre-anesthesia Checklist: Patient identified, Emergency Drugs available, Suction available, Patient being monitored and Timeout performed Patient Re-evaluated:Patient Re-evaluated prior to induction Oxygen Delivery Method: Circle system utilized Preoxygenation: Pre-oxygenation with 100% oxygen Induction Type: IV induction Ventilation: Mask ventilation without difficulty Laryngoscope Size: Mac and 4 Grade View: Grade II Tube type: Oral Number of attempts: 1 Airway Equipment and Method: Stylet and Oral airway Placement Confirmation: ETT inserted through vocal cords under direct vision, positive ETCO2, breath sounds checked- equal and bilateral and CO2 detector Secured at: 21 cm Tube secured with: Tape Dental Injury: Teeth and Oropharynx as per pre-operative assessment  Comments: LMA unable to seat well - elective change per Dr Jerrye to ETT for airway management. Intubation performed by Dr Jerrye. Smooth IV induction ETT placed BBS =

## 2024-01-02 NOTE — Anesthesia Procedure Notes (Signed)
 Procedure Name: MAC Date/Time: 01/02/2024 12:26 PM  Performed by: Pam Macario BROCKS, CRNAPre-anesthesia Checklist: Timeout performed, Patient being monitored, Suction available, Emergency Drugs available and Patient identified Patient Re-evaluated:Patient Re-evaluated prior to induction Oxygen Delivery Method: Simple face mask Preoxygenation: Pre-oxygenation with 100% oxygen Induction Type: IV induction Placement Confirmation: breath sounds checked- equal and bilateral, CO2 detector and positive ETCO2

## 2024-01-02 NOTE — Progress Notes (Signed)
Assisted Dr. Foster with right, supraclavicular, ultrasound guided block. Side rails up, monitors on throughout procedure. See vital signs in flow sheet. Tolerated Procedure well. 

## 2024-01-02 NOTE — Anesthesia Preprocedure Evaluation (Addendum)
 Anesthesia Evaluation  Patient identified by MRN, date of birth, ID band Patient awake    Reviewed: Allergy & Precautions, NPO status , Patient's Chart, lab work & pertinent test results  Airway Mallampati: I  TM Distance: >3 FB Neck ROM: Full    Dental no notable dental hx. (+) Teeth Intact   Pulmonary asthma , Current Smoker and Patient abstained from smoking., former smoker   Pulmonary exam normal breath sounds clear to auscultation       Cardiovascular negative cardio ROS Normal cardiovascular exam Rhythm:Regular Rate:Normal     Neuro/Psych  PSYCHIATRIC DISORDERS Anxiety     negative neurological ROS     GI/Hepatic negative GI ROS, Neg liver ROS,,,  Endo/Other  negative endocrine ROS    Renal/GU negative Renal ROS  negative genitourinary   Musculoskeletal Ulnar impaction syndrome Complex tear of triangular fibrocartilage of right wrist   Abdominal   Peds  Hematology negative hematology ROS (+)   Anesthesia Other Findings   Reproductive/Obstetrics                              Anesthesia Physical Anesthesia Plan  ASA: 2  Anesthesia Plan: Regional and General   Post-op Pain Management: Minimal or no pain anticipated   Induction: Intravenous  PONV Risk Score and Plan: 1 and Treatment may vary due to age or medical condition and Propofol  infusion  Airway Management Planned: Natural Airway, Nasal Cannula and Simple Face Mask  Additional Equipment: None  Intra-op Plan:   Post-operative Plan:   Informed Consent: I have reviewed the patients History and Physical, chart, labs and discussed the procedure including the risks, benefits and alternatives for the proposed anesthesia with the patient or authorized representative who has indicated his/her understanding and acceptance.     Dental advisory given  Plan Discussed with: Anesthesiologist and CRNA  Anesthesia Plan Comments:           Anesthesia Quick Evaluation

## 2024-01-02 NOTE — Discharge Instructions (Addendum)
 Hand Surgery Postop Instructions   Dressings: Maintain postoperative dressing until orthopedic follow-up.  Keep operative site clean and dry until orthopedic follow-up.  Wound Care: Keep your hand elevated above the level of your heart.  Do not allow it to dangle by your side. Moving your fingers is advised to stimulate circulation but will depend on the site of your surgery.  If you have a splint applied, your doctor will advise you regarding movement.  Activity: Do not drive or operate machinery until clearance given from physician. No heavy lifting with operative extremity.  Diet:  Drink liquids today or eat a light diet.  You may resume a regular diet tomorrow.    General expectations: Take prescribed medication if given, transition to over-the-counter medication as quickly as possible. Fingers may become slightly swollen.  Call your doctor if any of the following occur: Severe pain not relieved by pain medication. Elevated temperature. Dressing soaked with blood. Inability to move fingers. White or bluish color to fingers.   Per [facility] clinic policy, our goal is ensure optimal postoperative pain control with a multimodal pain management strategy. For all [facility] patients, our goal is to wean post-operative narcotic medications by 6 weeks post-operatively. If this is not possible due to utilization of pain medication prior to surgery, your [facility] doctor will support your acute post-operative pain control for the first 6 weeks postoperatively, with a plan to transition you back to your primary pain team following that. Cyndia Skeeters will work to ensure a Therapist, occupational.  Naeema Patlan Trevor Mace, M.D. Hand Surgery [facility]  Post Anesthesia Home Care Instructions  Activity: Get plenty of rest for the remainder of the day. A responsible individual must stay with you for 24 hours following the procedure.  For the next 24 hours, DO NOT: -Drive a  car -Advertising copywriter -Drink alcoholic beverages -Take any medication unless instructed by your physician -Make any legal decisions or sign important papers.  Meals: Start with liquid foods such as gelatin or soup. Progress to regular foods as tolerated. Avoid greasy, spicy, heavy foods. If nausea and/or vomiting occur, drink only clear liquids until the nausea and/or vomiting subsides. Call your physician if vomiting continues.  Special Instructions/Symptoms: Your throat may feel dry or sore from the anesthesia or the breathing tube placed in your throat during surgery. If this causes discomfort, gargle with warm salt water. The discomfort should disappear within 24 hours.  If you had a scopolamine patch placed behind your ear for the management of post- operative nausea and/or vomiting:  1. The medication in the patch is effective for 72 hours, after which it should be removed.  Wrap patch in a tissue and discard in the trash. Wash hands thoroughly with soap and water. 2. You may remove the patch earlier than 72 hours if you experience unpleasant side effects which may include dry mouth, dizziness or visual disturbances. 3. Avoid touching the patch. Wash your hands with soap and water after contact with the patch.    Regional Anesthesia Blocks  1. You may not be able to move or feel the "blocked" extremity after a regional anesthetic block. This may last may last from 3-48 hours after placement, but it will go away. The length of time depends on the medication injected and your individual response to the medication. As the nerves start to wake up, you may experience tingling as the movement and feeling returns to your extremity. If the numbness and inability to move your extremity  has not gone away after 48 hours, please call your surgeon.   2. The extremity that is blocked will need to be protected until the numbness is gone and the strength has returned. Because you cannot feel it, you  will need to take extra care to avoid injury. Because it may be weak, you may have difficulty moving it or using it. You may not know what position it is in without looking at it while the block is in effect.  3. For blocks in the legs and feet, returning to weight bearing and walking needs to be done carefully. You will need to wait until the numbness is entirely gone and the strength has returned. You should be able to move your leg and foot normally before you try and bear weight or walk. You will need someone to be with you when you first try to ensure you do not fall and possibly risk injury.  4. Bruising and tenderness at the needle site are common side effects and will resolve in a few days.  5. Persistent numbness or new problems with movement should be communicated to the surgeon or the [facility] [phone] [facility] [phone]).

## 2024-01-02 NOTE — Anesthesia Procedure Notes (Addendum)
 Anesthesia Regional Block: Supraclavicular block   Pre-Anesthetic Checklist: , timeout performed,  Correct Patient, Correct Site, Correct Laterality,  Correct Procedure, Correct Position, site marked,  Risks and benefits discussed,  Surgical consent,  Pre-op evaluation,  At surgeon's request and post-op pain management  Laterality: Right  Prep: chloraprep       Needles:  Injection technique: Single-shot  Needle Type: Echogenic Stimulator Needle     Needle Length: 10cm  Needle Gauge: 21   Needle insertion depth: 6 cm   Additional Needles:   Procedures:,,,, ultrasound used (permanent image in chart),,   Motor weakness within 5 minutes.  Narrative:  Start time: 01/02/2024 10:20 AM End time: 01/02/2024 10:25 AM Injection made incrementally with aspirations every 5 mL.  Performed by: Personally  Anesthesiologist: Jerrye Sharper, MD  Additional Notes: Timeout performed. Patient sedated. Relevant anatomy ID'd using US . Incremental 2-5ml injection of LA with frequent aspiration. Patient tolerated procedure well.

## 2024-01-02 NOTE — Anesthesia Procedure Notes (Signed)
 Procedure Name: LMA Insertion Date/Time: 01/02/2024 12:33 PM  Performed by: Pam Macario BROCKS, CRNAPre-anesthesia Checklist: Patient identified, Emergency Drugs available, Suction available, Patient being monitored and Timeout performed Patient Re-evaluated:Patient Re-evaluated prior to induction Oxygen Delivery Method: Circle system utilized Preoxygenation: Pre-oxygenation with 100% oxygen Induction Type: IV induction Ventilation: Mask ventilation without difficulty LMA: LMA inserted LMA Size: 4.0 Number of attempts: 1 Airway Equipment and Method: Bite block Placement Confirmation: positive ETCO2, breath sounds checked- equal and bilateral and CO2 detector Tube secured with: Tape Dental Injury: Teeth and Oropharynx as per pre-operative assessment  Comments: By Dr Jerrye

## 2024-01-03 ENCOUNTER — Encounter: Payer: Self-pay | Admitting: Surgical

## 2024-01-03 NOTE — Anesthesia Postprocedure Evaluation (Signed)
 Anesthesia Post Note  Patient: Kevin Zimmerman  Procedure(s) Performed: ARTHROSCOPY, WRIST WITH DEBRIDEMENT (Right: Wrist) SHORTENING, ULNA (Right: Wrist)     Patient location during evaluation: PACU Anesthesia Type: General Level of consciousness: awake and alert and oriented Pain management: pain level controlled Vital Signs Assessment: post-procedure vital signs reviewed and stable Respiratory status: spontaneous breathing, nonlabored ventilation and respiratory function stable Cardiovascular status: blood pressure returned to baseline and stable Postop Assessment: no apparent nausea or vomiting Anesthetic complications: no   No notable events documented.  Last Vitals:  Vitals:   01/02/24 1530 01/02/24 1608  BP: 129/83 126/77  Pulse: 96 82  Resp: 10 20  Temp:  36.8 C  SpO2: 94% 95%    Last Pain:  Vitals:   01/02/24 1608  TempSrc: Temporal  PainSc: 0-No pain                 Yasseen Salls A.

## 2024-01-05 ENCOUNTER — Encounter (HOSPITAL_BASED_OUTPATIENT_CLINIC_OR_DEPARTMENT_OTHER): Payer: Self-pay | Admitting: Orthopedic Surgery

## 2024-01-12 ENCOUNTER — Encounter (HOSPITAL_BASED_OUTPATIENT_CLINIC_OR_DEPARTMENT_OTHER): Payer: Self-pay | Admitting: Orthopedic Surgery

## 2024-01-12 NOTE — Addendum Note (Signed)
 Addendum  created 01/12/24 1136 by Jerrye Sharper, MD   Clinical Note Signed, Intraprocedure Blocks edited, SmartForm saved
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
## 2024-01-15 ENCOUNTER — Ambulatory Visit: Admitting: Rehabilitative and Restorative Service Providers"

## 2024-01-15 ENCOUNTER — Ambulatory Visit (INDEPENDENT_AMBULATORY_CARE_PROVIDER_SITE_OTHER): Admitting: Orthopedic Surgery

## 2024-01-15 ENCOUNTER — Ambulatory Visit (INDEPENDENT_AMBULATORY_CARE_PROVIDER_SITE_OTHER): Payer: Self-pay

## 2024-01-15 ENCOUNTER — Encounter: Payer: Self-pay | Admitting: Rehabilitative and Restorative Service Providers"

## 2024-01-15 DIAGNOSIS — R278 Other lack of coordination: Secondary | ICD-10-CM | POA: Diagnosis not present

## 2024-01-15 DIAGNOSIS — M25531 Pain in right wrist: Secondary | ICD-10-CM

## 2024-01-15 DIAGNOSIS — M6281 Muscle weakness (generalized): Secondary | ICD-10-CM | POA: Diagnosis not present

## 2024-01-15 DIAGNOSIS — R600 Localized edema: Secondary | ICD-10-CM | POA: Diagnosis not present

## 2024-01-15 DIAGNOSIS — M25631 Stiffness of right wrist, not elsewhere classified: Secondary | ICD-10-CM

## 2024-01-15 DIAGNOSIS — M25641 Stiffness of right hand, not elsewhere classified: Secondary | ICD-10-CM

## 2024-01-15 NOTE — Progress Notes (Signed)
   Kevin Zimmerman - 23 y.o. male MRN [id number]  Date of birth: [birth year]  Office Visit Note: Visit Date: 01/15/2024 PCP: Kennyth Worth HERO, MD Referred by: Kennyth Worth HERO, MD  Subjective:  HPI: Kevin Zimmerman is a 23 y.o. male who presents today for follow up 2 weeks status post right wrist arthroscopy with TFCC debridement, right wrist ulnar shortening osteotomy, right wrist capsulorrhaphy for carpal instability.  Doing well overall, pain controlled at rest.  Pertinent ROS were reviewed with the patient and found to be negative unless otherwise specified above in HPI.   Assessment & Plan: Visit Diagnoses:  1. Pain in right wrist     Plan: He will be seen by occupational therapy today for fabrication of a [place] orthosis.  Okay to begin range of motion of the elbow and digits, will refrain from rotation at the forearm for the time being to allow for ongoing healing.  Follow-up with myself in approximately 2 weeks.  Follow-up: No follow-ups on file.   Meds & Orders: No orders of the defined types were placed in this encounter.   Orders Placed This Encounter  Procedures   XR Forearm Right     Procedures: No procedures performed       Objective:   Vital Signs: There were no vitals taken for this visit.  Ortho Exam Right wrist: -Well-healing ulnar incision, portal sites well-healing at the wrist, sutures removed today, skin edges well-approximated -Digital range of motion is preserved, limited secondary to immobilization - Sensation intact in all distribution median/radial/ulnar, AIN/PIN/interosseous intact  Imaging: XR Forearm Right Result Date: 01/15/2024 X-rays of the right forearm demonstrate stable appearance of the ulnar shortening osteotomy plate.  Remains well-fixed in all planes.  Ulnar neutral variance.    [name] Afton Alderton, M.D.  [facility], Hand [facility]
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
## 2024-01-26 ENCOUNTER — Encounter: Payer: Self-pay | Admitting: Rehabilitative and Restorative Service Providers"

## 2024-01-26 ENCOUNTER — Ambulatory Visit: Admitting: Rehabilitative and Restorative Service Providers"

## 2024-01-26 DIAGNOSIS — M25531 Pain in right wrist: Secondary | ICD-10-CM

## 2024-01-26 DIAGNOSIS — M6281 Muscle weakness (generalized): Secondary | ICD-10-CM | POA: Diagnosis not present

## 2024-01-26 DIAGNOSIS — R600 Localized edema: Secondary | ICD-10-CM | POA: Diagnosis not present

## 2024-01-26 DIAGNOSIS — M25631 Stiffness of right wrist, not elsewhere classified: Secondary | ICD-10-CM | POA: Diagnosis not present

## 2024-01-26 DIAGNOSIS — M25641 Stiffness of right hand, not elsewhere classified: Secondary | ICD-10-CM

## 2024-01-26 DIAGNOSIS — R278 Other lack of coordination: Secondary | ICD-10-CM
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
## 2024-02-02 ENCOUNTER — Encounter: Payer: Self-pay | Admitting: Rehabilitative and Restorative Service Providers"

## 2024-02-02 ENCOUNTER — Ambulatory Visit: Admitting: Rehabilitative and Restorative Service Providers"

## 2024-02-02 DIAGNOSIS — M6281 Muscle weakness (generalized): Secondary | ICD-10-CM | POA: Diagnosis not present

## 2024-02-02 DIAGNOSIS — R278 Other lack of coordination: Secondary | ICD-10-CM | POA: Diagnosis not present

## 2024-02-02 DIAGNOSIS — R600 Localized edema: Secondary | ICD-10-CM

## 2024-02-02 DIAGNOSIS — M25531 Pain in right wrist: Secondary | ICD-10-CM

## 2024-02-02 DIAGNOSIS — M25631 Stiffness of right wrist, not elsewhere classified: Secondary | ICD-10-CM | POA: Diagnosis not present

## 2024-02-02 DIAGNOSIS — M25641 Stiffness of right hand, not elsewhere classified: Secondary | ICD-10-CM

## 2024-02-04 ENCOUNTER — Ambulatory Visit (INDEPENDENT_AMBULATORY_CARE_PROVIDER_SITE_OTHER): Admitting: Orthopedic Surgery

## 2024-02-04 ENCOUNTER — Other Ambulatory Visit

## 2024-02-04 DIAGNOSIS — M25831 Other specified joint disorders, right wrist: Secondary | ICD-10-CM

## 2024-02-04 DIAGNOSIS — S63591A Other specified sprain of right wrist, initial encounter: Secondary | ICD-10-CM | POA: Diagnosis not present

## 2024-02-04 NOTE — Progress Notes (Signed)
   YEISON SIPPEL - 23 y.o. male MRN [id number]  Date of birth: 2000-11-18  Office Visit Note: Visit Date: 02/04/2024 PCP: Kennyth Worth HERO, MD Referred by: Kennyth Worth HERO, MD  Subjective:  HPI: DARROLL BREDESON is a 23 y.o. male who presents today for follow up 5 weeks status post right wrist arthroscopy with TFCC debridement, right wrist ulnar shortening osteotomy, right wrist capsulorrhaphy for carpal instability.  He is doing well overall, pain is controlled.  Pertinent ROS were reviewed with the patient and found to be negative unless otherwise specified above in HPI.   Assessment & Plan: Visit Diagnoses:  1. Ulnar impaction syndrome, right   2. Complex tear of triangular fibrocartilage of right wrist, initial encounter     Plan: Continue with occupational therapy as instructed.  He has his fabricated splint today.  He can continue with range of motion exercises for additional 3 weeks.  Follow-up at that time for repeat clinical and radiographic check.  Should he demonstrate appropriate bony union at that point we can begin light strengthening.  Follow-up: No follow-ups on file.   Meds & Orders: No orders of the defined types were placed in this encounter.   Orders Placed This Encounter  Procedures   XR Wrist Complete Right     Procedures: No procedures performed       Objective:   Vital Signs: There were no vitals taken for this visit.  Ortho Exam Right wrist: - Well-healed ulnar incision, skin edges well-approximated without erythema or drainage - Wrist range of motion flexion/extension 30/35, supination/pronation 25/55, composite fist on examination today - Sensation intact in all distributions distally, slightly diminished over the incisional region  Imaging: XR Wrist Complete Right Result Date: 02/04/2024 X-rays of the right wrist demonstrate stable appearance of the ulnar osteotomy site, hardware remains well-fixed in all planes, ulnar neutral  variance.    Barbarajean Kinzler Afton Alderton, M.D. [facility], Hand Surgery
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
## 2024-02-09 ENCOUNTER — Ambulatory Visit (INDEPENDENT_AMBULATORY_CARE_PROVIDER_SITE_OTHER): Admitting: Rehabilitative and Restorative Service Providers"

## 2024-02-09 ENCOUNTER — Encounter: Payer: Self-pay | Admitting: Rehabilitative and Restorative Service Providers"

## 2024-02-09 DIAGNOSIS — M6281 Muscle weakness (generalized): Secondary | ICD-10-CM

## 2024-02-09 DIAGNOSIS — M25531 Pain in right wrist: Secondary | ICD-10-CM | POA: Diagnosis not present

## 2024-02-09 DIAGNOSIS — R278 Other lack of coordination: Secondary | ICD-10-CM | POA: Diagnosis not present

## 2024-02-09 DIAGNOSIS — M25631 Stiffness of right wrist, not elsewhere classified: Secondary | ICD-10-CM

## 2024-02-09 DIAGNOSIS — R600 Localized edema: Secondary | ICD-10-CM

## 2024-02-09 DIAGNOSIS — M25641 Stiffness of right hand, not elsewhere classified: Secondary | ICD-10-CM
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
## 2024-02-16 ENCOUNTER — Ambulatory Visit (INDEPENDENT_AMBULATORY_CARE_PROVIDER_SITE_OTHER): Admitting: Rehabilitative and Restorative Service Providers"

## 2024-02-16 ENCOUNTER — Encounter: Payer: Self-pay | Admitting: Radiology

## 2024-02-16 ENCOUNTER — Encounter: Payer: Self-pay | Admitting: Rehabilitative and Restorative Service Providers"

## 2024-02-16 DIAGNOSIS — R278 Other lack of coordination: Secondary | ICD-10-CM

## 2024-02-16 DIAGNOSIS — M6281 Muscle weakness (generalized): Secondary | ICD-10-CM | POA: Diagnosis not present

## 2024-02-16 DIAGNOSIS — R600 Localized edema: Secondary | ICD-10-CM | POA: Diagnosis not present

## 2024-02-16 DIAGNOSIS — M25631 Stiffness of right wrist, not elsewhere classified: Secondary | ICD-10-CM

## 2024-02-16 DIAGNOSIS — M25531 Pain in right wrist: Secondary | ICD-10-CM | POA: Diagnosis not present

## 2024-02-16 DIAGNOSIS — M25641 Stiffness of right hand, not elsewhere classified: Secondary | ICD-10-CM
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
## 2024-02-23 ENCOUNTER — Encounter: Admitting: Rehabilitative and Restorative Service Providers"

## 2024-02-23 ENCOUNTER — Telehealth: Payer: Self-pay | Admitting: Rehabilitative and Restorative Service Providers"

## 2024-02-23 NOTE — Telephone Encounter (Signed)
 OT called patient to discuss missed appointment. OT attempt to left message with pt about next appointment date/time, but his VM box was full. Future missed appointments could lead to early discharge from therapy. He has a final appointment next week

## 2024-02-24 NOTE — Progress Notes (Unsigned)
   Kevin Zimmerman - 23 y.o. male MRN [id number]  Date of birth: 29-Oct-2000  Office Visit Note: Visit Date: 02/25/2024 PCP: Kennyth Worth HERO, MD Referred by: Kennyth Worth HERO, MD  Subjective:  HPI: Kevin Zimmerman is a 23 y.o. male who presents today for follow up 8 weeks status post right wrist arthroscopy with TFCC debridement. Right wrist ulnar shortening osteotomy. Right wrist capsulorrhaphy for carpal instability.  Pertinent ROS were reviewed with the patient and found to be negative unless otherwise specified above in HPI.   Assessment & Plan: Visit Diagnoses: No diagnosis found.  Plan: ***  Follow-up: No follow-ups on file.   Meds & Orders: No orders of the defined types were placed in this encounter.  No orders of the defined types were placed in this encounter.    Procedures: No procedures performed       Objective:   Vital Signs: There were no vitals taken for this visit.  Ortho Exam ***  Imaging: No results found.   Melanie Pellot Afton Alderton, M.D. [facility], Hand Surgery

## 2024-02-25 ENCOUNTER — Other Ambulatory Visit (INDEPENDENT_AMBULATORY_CARE_PROVIDER_SITE_OTHER): Payer: Self-pay

## 2024-02-25 ENCOUNTER — Ambulatory Visit (INDEPENDENT_AMBULATORY_CARE_PROVIDER_SITE_OTHER): Admitting: Orthopedic Surgery

## 2024-02-25 DIAGNOSIS — M25831 Other specified joint disorders, right wrist: Secondary | ICD-10-CM
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
## 2024-03-01 ENCOUNTER — Ambulatory Visit: Admitting: Rehabilitative and Restorative Service Providers"

## 2024-03-01 ENCOUNTER — Encounter: Payer: Self-pay | Admitting: Rehabilitative and Restorative Service Providers"

## 2024-03-01 DIAGNOSIS — R278 Other lack of coordination: Secondary | ICD-10-CM | POA: Diagnosis not present

## 2024-03-01 DIAGNOSIS — R600 Localized edema: Secondary | ICD-10-CM

## 2024-03-01 DIAGNOSIS — M25531 Pain in right wrist: Secondary | ICD-10-CM

## 2024-03-01 DIAGNOSIS — M6281 Muscle weakness (generalized): Secondary | ICD-10-CM

## 2024-03-01 DIAGNOSIS — M25641 Stiffness of right hand, not elsewhere classified: Secondary | ICD-10-CM

## 2024-03-01 DIAGNOSIS — M25631 Stiffness of right wrist, not elsewhere classified: Secondary | ICD-10-CM

## 2024-03-01 NOTE — Addendum Note (Signed)
 Addended by: GEORGINA LIMES D on: 03/01/2024 10:53 AM   Modules accepted: Orders

## 2024-03-23 NOTE — Progress Notes (Deleted)
   LADARRION TELFAIR - 23 y.o. male MRN [id number]  Date of birth: 10/06/2000  Office Visit Note: Visit Date: 03/24/2024 PCP: Kennyth Worth HERO, MD Referred by: Kennyth Worth HERO, MD  Subjective:  HPI: Kevin Zimmerman is a 24 y.o. male who presents today for follow up 12 weeks status post right wrist arthroscopy with TFCC debridement. Right wrist ulnar shortening osteotomy. Right wrist capsulorrhaphy for carpal instability.  Continues to do very well overall.  Pertinent ROS were reviewed with the patient and found to be negative unless otherwise specified above in HPI.   Assessment & Plan: Visit Diagnoses:  No diagnosis found.   Plan: He is doing excellent postoperatively.  He has minimal pain on examination today and his range of motion is excellent.  He can continue with activities as tolerated at this juncture without restriction.  Follow-up: No follow-ups on file.   Meds & Orders: No orders of the defined types were placed in this encounter.   No orders of the defined types were placed in this encounter.    Procedures: No procedures performed       Objective:   Vital Signs: There were no vitals taken for this visit.  Ortho Exam Right wrist: - Flexion/extension *** - Pronation/supination *** - No pain with ulnar deviation, no significant foveal tenderness - Composite fist without restriction - Sensation intact median/radial/ulnar, AIN/PIN/interosseous intact - Grip strength Jamar 2  ***  Imaging: No results found.    Anshul Afton Alderton, M.D. [facility], Hand Surgery

## 2024-03-24 ENCOUNTER — Encounter: Admitting: Orthopedic Surgery

## 2024-05-11 ENCOUNTER — Encounter: Payer: Self-pay | Admitting: Physician Assistant

## 2024-05-11 ENCOUNTER — Ambulatory Visit: Admitting: Physician Assistant

## 2024-05-11 VITALS — BP 118/80 | HR 110 | Temp 99.9°F | Ht 67.0 in | Wt 137.0 lb

## 2024-05-11 DIAGNOSIS — J4541 Moderate persistent asthma with (acute) exacerbation: Secondary | ICD-10-CM

## 2024-05-11 DIAGNOSIS — H6692 Otitis media, unspecified, left ear: Secondary | ICD-10-CM

## 2024-05-11 DIAGNOSIS — J101 Influenza due to other identified influenza virus with other respiratory manifestations: Secondary | ICD-10-CM

## 2024-05-11 LAB — POC INFLUENZA A&B (BINAX/QUICKVUE)
Influenza A, POC: NEGATIVE
Influenza B, POC: POSITIVE — AB

## 2024-05-11 MED ORDER — PREDNISONE 50 MG PO TABS: ORAL_TABLET | ORAL | 0 refills | Status: DC

## 2024-05-11 MED ORDER — AZITHROMYCIN 250 MG PO TABS: ORAL_TABLET | ORAL | 0 refills | Status: AC

## 2024-05-11 NOTE — Progress Notes (Signed)
 "  History of Present Illness:   Chief Complaint  Patient presents with   Sore Throat    Pt said started on Saturday with headache and sore throat, chills, body aches and vomiting. Sore throat gone, having some SOB, Hx Asthma & Pneumonia.    Discussed the use of AI scribe software for clinical note transcription with the patient, who gave verbal consent to proceed.  History of Present Illness   ANTAEUS KAREL is a 24 year old male with asthma who presents with flu symptoms.  Symptoms started Saturday night with headache and sore throat that have resolved. He now has fatigue, body soreness, vomiting, feels feverish without a documented temperature, has poor oral intake, and possible constipation without diarrhea.  He has ear pain with intermittent pressure and popping.  He is concerned because he often develops pneumonia or bronchitis after the flu.  He has asthma and uses albuterol  and Advair daily. He cannot miss Advair for more than three days without significant symptoms. He has taken prednisone  before with good effect but dislikes the side effects.  He works as a investment banker, corporate and has missed work for two days due to illness. He lives with others and has informed them of his flu diagnosis.        Past Medical History:  Diagnosis Date   ADHD (attention deficit hyperactivity disorder)    Anxiety    Asthma    Colon polyps    Seasonal allergies      Social History[1]  Past Surgical History:  Procedure Laterality Date   ARTHROSCOPY, WRIST WITH DEBRIDEMENT Right 01/02/2024   Procedure: ARTHROSCOPY, WRIST WITH DEBRIDEMENT;  Surgeon: Arlinda Buster, MD;  Location: [facility];  Service: Orthopedics;  Laterality: Right;  RIGHT WRIST ARTHROSCOPY WITH TRIANGULAR FIBROCARTILAGE COMPLEX REPAIR VS DEBRIDEMENT / ULNAR SHORTENING OSTEOTOMY   COLONOSCOPY     COLONOSCOPY N/A 10/09/2012   Procedure: COLONOSCOPY;  Surgeon: Fairy VEAR Gaskins, MD;  Location: [facility] OR;  Service:  Gastroenterology;  Laterality: N/A;   ULNA OSTEOTOMY Right 01/02/2024   Procedure: SHORTENING, ULNA;  Surgeon: Arlinda Buster, MD;  Location: [facility];  Service: Orthopedics;  Laterality: Right;    Family History  Problem Relation Age of Onset   Colon polyps Paternal Grandfather    Cancer Paternal Grandfather    Asthma Maternal Uncle    Depression Maternal Uncle    Early death Paternal Uncle    Heart disease Paternal Uncle    Hypertension Paternal Uncle    Heart attack Paternal Uncle        age 55   Cancer Maternal Grandmother    Depression Maternal Grandfather     Allergies[2]  Current Medications:  Current Medications[3]   Review of Systems:   Negative unless otherwise specified per HPI.  Vitals:   Vitals:   05/11/24 1331  BP: 118/80  Pulse: (!) 110  Temp: 99.9 F (37.7 C)  TempSrc: Temporal  SpO2: 98%  Weight: 137 lb (62.1 kg)  Height: 5' 7 (1.702 m)     Body mass index is 21.46 kg/m.  Physical Exam:   Physical Exam Vitals and nursing note reviewed.  Constitutional:      General: He is not in acute distress.    Appearance: He is well-developed. He is not ill-appearing or toxic-appearing.  HENT:     Head: Normocephalic and atraumatic.     Right Ear: Tympanic membrane, ear canal and external ear normal. Tympanic membrane is not erythematous, retracted or  bulging.     Left Ear: Ear canal and external ear normal. A middle ear effusion is present. Tympanic membrane is not erythematous, retracted or bulging.     Nose: Nose normal.     Right Sinus: No maxillary sinus tenderness or frontal sinus tenderness.     Left Sinus: No maxillary sinus tenderness or frontal sinus tenderness.     Mouth/Throat:     Pharynx: Uvula midline. No posterior oropharyngeal erythema.  Eyes:     General: Lids are normal.     Conjunctiva/sclera: Conjunctivae normal.  Neck:     Trachea: Trachea normal.  Cardiovascular:     Rate and Rhythm: Normal rate and  regular rhythm.     Heart sounds: Normal heart sounds, S1 normal and S2 normal.  Pulmonary:     Effort: Pulmonary effort is normal.     Breath sounds: Normal breath sounds. No decreased breath sounds, wheezing, rhonchi or rales.  Lymphadenopathy:     Cervical: No cervical adenopathy.  Skin:    General: Skin is warm and dry.  Neurological:     Mental Status: He is alert.  Psychiatric:        Speech: Speech normal.        Behavior: Behavior normal. Behavior is cooperative.    Results for orders placed or performed in visit on 05/11/24  POC Influenza A&B(BINAX/QUICKVUE)  Result Value Ref Range   Influenza A, POC Negative Negative   Influenza B, POC Positive (A) Negative    Assessment and Plan:   Assessment and Plan    Influenza B infection Confirmed by positive test. Symptoms include fatigue, soreness, vomiting, and fever. Out of window for Tamiflu . Emphasized informing household contacts about exposure. - Advised rest and symptomatic treatment with ibuprofen  or naproxen. - Instructed to inform household contacts about flu exposure.  Left otitis media, unspecified otitis media type  Left ear infection with intermittent pain and pressure. Decision to start antibiotics due to infection and potential secondary infections. - Prescribed azithromycin  (Z-Pak).  Moderate persistent asthma with acute exacerbation  Severe asthma with current exacerbation. Reports difficulty and pain in breathing. History of requiring prednisone  for exacerbations. Decision to start prednisone  due to current symptoms and history. - Started prednisone . - Continue daily Advair and use albuterol  as needed.      Lucie Buttner, PA-C    [1]  Social History Tobacco Use   Smoking status: Former    Current packs/day: 0.00    Types: Cigarettes    Quit date: 03/21/2017    Years since quitting: 7.1   Tobacco comments:    vapes  Vaping Use   Vaping status: Every Day  Substance Use Topics   Alcohol use:  Yes    Alcohol/week: 1.0 standard drink of alcohol    Types: 1 Cans of beer per week   Drug use: Yes    Types: Marijuana    Comment: Rarely uses marijuana  [2]  Allergies Allergen Reactions   Food Anaphylaxis    Tested positive to peanuts years ago but tolerates peanuts well at present. Had an anaphylactic reaction to micro-protein Jocelyne) about one year ago that required treatment with steroids and overnight hospitalization.   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   Penicillins Anaphylaxis  [3]  Current Outpatient Medications:    albuterol  (PROVENTIL  HFA;VENTOLIN  HFA) 108 (90 Base) MCG/ACT inhaler, Inhale 2 puffs into the lungs every 6 (six) hours as needed for wheezing., Disp: 1 Inhaler, Rfl: 1   azithromycin  (ZITHROMAX ) 250 MG tablet, Take  2 tablets on day 1, then 1 tablet daily on days 2 through 5, Disp: 6 tablet, Rfl: 0   EPINEPHrine  0.3 mg/0.3 mL IJ SOAJ injection, Inject 0.3 mLs (0.3 mg total) into the muscle once., Disp: 1 Device, Rfl: 2   fluticasone -salmeterol (ADVAIR HFA) 115-21 MCG/ACT inhaler, Inhale 2 puffs into the lungs every morning., Disp: 1 Inhaler, Rfl: 1   predniSONE  (DELTASONE ) 50 MG tablet, Take 1 tablet daily by mouth, Disp: 5 tablet, Rfl: 0  "

## 2024-05-13 ENCOUNTER — Ambulatory Visit: Payer: Self-pay

## 2024-05-13 ENCOUNTER — Emergency Department (HOSPITAL_BASED_OUTPATIENT_CLINIC_OR_DEPARTMENT_OTHER): Admission: EM | Admit: 2024-05-13 | Discharge: 2024-05-13 | Discharge status: Against Medical Advice

## 2024-05-13 ENCOUNTER — Encounter (HOSPITAL_BASED_OUTPATIENT_CLINIC_OR_DEPARTMENT_OTHER): Payer: Self-pay | Admitting: Emergency Medicine

## 2024-05-13 ENCOUNTER — Other Ambulatory Visit: Payer: Self-pay

## 2024-05-13 ENCOUNTER — Other Ambulatory Visit (HOSPITAL_BASED_OUTPATIENT_CLINIC_OR_DEPARTMENT_OTHER): Payer: Self-pay

## 2024-05-13 DIAGNOSIS — J101 Influenza due to other identified influenza virus with other respiratory manifestations: Secondary | ICD-10-CM | POA: Diagnosis not present

## 2024-05-13 DIAGNOSIS — R112 Nausea with vomiting, unspecified: Secondary | ICD-10-CM | POA: Diagnosis present

## 2024-05-13 DIAGNOSIS — Z5329 Procedure and treatment not carried out because of patient's decision for other reasons: Secondary | ICD-10-CM | POA: Insufficient documentation

## 2024-05-13 DIAGNOSIS — R799 Abnormal finding of blood chemistry, unspecified: Secondary | ICD-10-CM | POA: Diagnosis not present

## 2024-05-13 DIAGNOSIS — J111 Influenza due to unidentified influenza virus with other respiratory manifestations: Secondary | ICD-10-CM

## 2024-05-13 LAB — CBC WITH DIFFERENTIAL/PLATELET
Abs Immature Granulocytes: 0.04 10*3/uL (ref 0.00–0.07)
Basophils Absolute: 0 10*3/uL (ref 0.0–0.1)
Basophils Relative: 0 %
Eosinophils Absolute: 0.1 10*3/uL (ref 0.0–0.5)
Eosinophils Relative: 1 %
HCT: 47.9 % (ref 39.0–52.0)
Hemoglobin: 17.1 g/dL — ABNORMAL HIGH (ref 13.0–17.0)
Immature Granulocytes: 0 %
Lymphocytes Relative: 15 %
Lymphs Abs: 1.4 10*3/uL (ref 0.7–4.0)
MCH: 31.1 pg (ref 26.0–34.0)
MCHC: 35.7 g/dL (ref 30.0–36.0)
MCV: 87.2 fL (ref 80.0–100.0)
Monocytes Absolute: 0.9 10*3/uL (ref 0.1–1.0)
Monocytes Relative: 10 %
Neutro Abs: 6.7 10*3/uL (ref 1.7–7.7)
Neutrophils Relative %: 74 %
Platelets: 228 10*3/uL (ref 150–400)
RBC: 5.49 MIL/uL (ref 4.22–5.81)
RDW: 11.9 % (ref 11.5–15.5)
WBC: 9.1 10*3/uL (ref 4.0–10.5)
nRBC: 0 % (ref 0.0–0.2)

## 2024-05-13 LAB — COMPREHENSIVE METABOLIC PANEL WITH GFR
ALT: 22 U/L (ref 0–44)
AST: 23 U/L (ref 15–41)
Albumin: 5.3 g/dL — ABNORMAL HIGH (ref 3.5–5.0)
Alkaline Phosphatase: 89 U/L (ref 38–126)
Anion gap: 21 — ABNORMAL HIGH (ref 5–15)
BUN: 11 mg/dL (ref 6–20)
CO2: 21 mmol/L — ABNORMAL LOW (ref 22–32)
Calcium: 10.8 mg/dL — ABNORMAL HIGH (ref 8.9–10.3)
Chloride: 98 mmol/L (ref 98–111)
Creatinine, Ser: 0.85 mg/dL (ref 0.61–1.24)
GFR, Estimated: >60 mL/min
Glucose, Bld: 192 mg/dL — ABNORMAL HIGH (ref 70–99)
Potassium: 4.3 mmol/L (ref 3.5–5.1)
Sodium: 140 mmol/L (ref 135–145)
Total Bilirubin: 0.5 mg/dL (ref 0.0–1.2)
Total Protein: 8.5 g/dL — ABNORMAL HIGH (ref 6.5–8.1)

## 2024-05-13 LAB — MAGNESIUM: Magnesium: 1.9 mg/dL (ref 1.7–2.4)

## 2024-05-13 LAB — CBG MONITORING, ED: Glucose-Capillary: 108 mg/dL — ABNORMAL HIGH (ref 70–99)

## 2024-05-13 LAB — LIPASE, BLOOD: Lipase: 20 U/L (ref 11–51)

## 2024-05-13 MED ORDER — HALOPERIDOL LACTATE 5 MG/ML IJ SOLN
2.0000 mg | Freq: Once | INTRAMUSCULAR | Status: AC
  Administered 2024-05-13: 2 mg via INTRAVENOUS
  Filled 2024-05-13: qty 1

## 2024-05-13 MED ORDER — SODIUM CHLORIDE 0.9 % IV BOLUS
1000.0000 mL | Freq: Once | INTRAVENOUS | Status: AC
  Administered 2024-05-13: 1000 mL via INTRAVENOUS

## 2024-05-13 MED ORDER — ONDANSETRON HCL 4 MG PO TABS: 4.0000 mg | ORAL_TABLET | Freq: Four times a day (QID) | ORAL | 0 refills | Status: DC

## 2024-05-13 MED ORDER — ONDANSETRON HCL 4 MG/2ML IJ SOLN
4.0000 mg | Freq: Once | INTRAMUSCULAR | Status: AC
  Administered 2024-05-13: 4 mg via INTRAVENOUS
  Filled 2024-05-13: qty 2

## 2024-05-13 NOTE — ED Notes (Signed)
 ED Provider at bedside.

## 2024-05-13 NOTE — ED Notes (Addendum)
 Pt requesting to leave AMA stating I don't think I can give a urine sample and would like to go home and follow up later. Repeat CBG 108. Dr. Simon notified and at bedside

## 2024-05-13 NOTE — ED Provider Notes (Signed)
 " [place] EMERGENCY DEPARTMENT AT [facility] Provider Note   CSN: [id number] Arrival date & time: 05/13/24  9068     Patient presents with: Emesis   Kevin Zimmerman is a 24 y.o. male.   HPI   Patient presents with vomiting.  Patient states that he has been vomiting for the past 4 to 5 days.  States he is mostly dry heaving at this point time.  Having a hard time tolerating p.o.  Last bowel movement was this morning.  Large bowel movement.  Patient noticed that over the past couple of weeks sometimes feels some more dark green bowel movements.  Denied any kind of obvious black in nature but more very dark green.  No hematemesis.  No hematochezia.  Endorses no fever or chills.  No chest pain or shortness of breath.  Patient states every time he tries to eat or drink he subsequently feels like he is got a vomit.  And compliant with all of his medication.  Patient states that occasionally uses marijuana use.  No history of cyclical vomiting in the past.  No previous abdominal surgeries.  Endorses history of colonoscopies for polyp removals.  Previous medical history reviewed : Patient went to primary care physician office.  Sore throat.  Headache chills body aches vomiting.  Diagnosed with the flu.   Prior to Admission medications  Medication Sig Start Date End Date Taking? Authorizing Provider  ondansetron  (ZOFRAN ) 4 MG tablet Take 1 tablet (4 mg total) by mouth every 6 (six) hours. 05/13/24  Yes Simon Lavonia SAILOR, MD  albuterol  (PROVENTIL  HFA;VENTOLIN  HFA) 108 (90 Base) MCG/ACT inhaler Inhale 2 puffs into the lungs every 6 (six) hours as needed for wheezing. 09/26/17   Kennyth Worth HERO, MD  azithromycin  (ZITHROMAX ) 250 MG tablet Take 2 tablets on day 1, then 1 tablet daily on days 2 through 5 05/11/24 05/16/24  Job Lukes, PA  EPINEPHrine  0.3 mg/0.3 mL IJ SOAJ injection Inject 0.3 mLs (0.3 mg total) into the muscle once. 12/21/14   Lang Maxwell, NP  fluticasone -salmeterol (ADVAIR  HFA) 115-21 MCG/ACT inhaler Inhale 2 puffs into the lungs every morning. 09/26/17   Kennyth Worth HERO, MD  predniSONE  (DELTASONE ) 50 MG tablet Take 1 tablet daily by mouth 05/11/24   Job Lukes, PA    Allergies: Food, Mold extract [trichophyton], and Penicillins    Review of Systems  Constitutional:  Negative for chills and fever.  HENT:  Negative for ear pain and sore throat.   Eyes:  Negative for pain and visual disturbance.  Respiratory:  Negative for cough and shortness of breath.   Cardiovascular:  Negative for chest pain and palpitations.  Gastrointestinal:  Negative for abdominal pain and vomiting.  Genitourinary:  Negative for dysuria and hematuria.  Musculoskeletal:  Negative for arthralgias and back pain.  Skin:  Negative for color change and rash.  Neurological:  Negative for seizures and syncope.  All other systems reviewed and are negative.   Updated Vital Signs BP 126/69   Pulse (!) 57   Temp (!) 97.5 F (36.4 C) (Oral)   Resp 18   SpO2 100%   Physical Exam Vitals and nursing note reviewed.  Constitutional:      General: He is not in acute distress.    Appearance: He is well-developed.  HENT:     Head: Normocephalic and atraumatic.  Eyes:     Conjunctiva/sclera: Conjunctivae normal.  Cardiovascular:     Rate and Rhythm: Normal rate and regular rhythm.  Heart sounds: No murmur heard. Pulmonary:     Effort: Pulmonary effort is normal. No respiratory distress.     Breath sounds: Normal breath sounds.  Abdominal:     Palpations: Abdomen is soft.     Tenderness: There is no abdominal tenderness.  Musculoskeletal:        General: No swelling.     Cervical back: Neck supple.  Skin:    General: Skin is warm and dry.     Capillary Refill: Capillary refill takes less than 2 seconds.  Neurological:     Mental Status: He is alert.  Psychiatric:        Mood and Affect: Mood normal.     (all labs ordered are listed, but only abnormal results are  displayed) Labs Reviewed  CBC WITH DIFFERENTIAL/PLATELET - Abnormal; Notable for the following components:      Result Value   Hemoglobin 17.1 (*)    All other components within normal limits  COMPREHENSIVE METABOLIC PANEL WITH GFR - Abnormal; Notable for the following components:   CO2 21 (*)    Glucose, Bld 192 (*)    Calcium 10.8 (*)    Total Protein 8.5 (*)    Albumin 5.3 (*)    Anion gap 21 (*)    All other components within normal limits  CBG MONITORING, ED - Abnormal; Notable for the following components:   Glucose-Capillary 108 (*)    All other components within normal limits  LIPASE, BLOOD  MAGNESIUM  URINALYSIS, ROUTINE W REFLEX MICROSCOPIC    EKG: EKG Interpretation Date/Time:  Thursday May 13 2024 09:59:25 EST Ventricular Rate:  50 PR Interval:  137 QRS Duration:  89 QT Interval:  452 QTC Calculation: 413 R Axis:   74  Text Interpretation: Sinus rhythm Confirmed by Simon Rea [phone]) on 05/13/2024 10:19:31 AM  Radiology: No results found.   Procedures   Medications Ordered in the ED  sodium chloride  0.9 % bolus 1,000 mL (0 mLs Intravenous Stopped 05/13/24 1253)  sodium chloride  0.9 % bolus 1,000 mL (0 mLs Intravenous Stopped 05/13/24 1104)  ondansetron  (ZOFRAN ) injection 4 mg (4 mg Intravenous Given 05/13/24 0948)  haloperidol  lactate (HALDOL ) injection 2 mg (2 mg Intravenous Given 05/13/24 1025)                                    Medical Decision Making Amount and/or Complexity of Data Reviewed Labs: ordered.  Risk Prescription drug management.     HPI:   Patient presents with vomiting.  Patient states that he has been vomiting for the past 4 to 5 days.  States he is mostly dry heaving at this point time.  Having a hard time tolerating p.o.  Last bowel movement was this morning.  Large bowel movement.  Patient noticed that over the past couple of weeks sometimes feels some more dark green bowel movements.  Denied any kind of obvious black in  nature but more very dark green.  No hematemesis.  No hematochezia.  Endorses no fever or chills.  No chest pain or shortness of breath.  Patient states every time he tries to eat or drink he subsequently feels like he is got a vomit.  And compliant with all of his medication.  Patient states that occasionally uses marijuana use.  No history of cyclical vomiting in the past.  No previous abdominal surgeries.  Endorses history of colonoscopies for polyp removals.  Previous medical history reviewed : Patient went to primary care physician office.  Sore throat.  Headache chills body aches vomiting.  Diagnosed with the flu.  MDM:   Upon examination, patient hemodynamically stable. A&O x 3 with GCS 15.   His lungs are clear to auscultation bilaterally.  He has a soft and benign abdomen at this time.  No significant distress.  No increased work of breathing.  Will obtain EKG to check QTc and give Zofran .  Will obtain basic laboratory workup including electrolytes and LFTs and lipase to rule out pancreatitis or large electrolyte derangements.  Patient will be started on 2 L of normal saline.  I think this is all likely related to his recent diagnosis of the flu.    Reevaluation:   Upon reexamination, patient hemodynamically stable.  Remains A&O x 3 with GCS 15.  Patient received both Zofran  as well as Haldol  for ongoing emesis.  Subsequently after Haldol , symptoms completely resolved.  Tolerating p.o. at this point in time.  Benign and soft abdomen.  No indication for CT imaging.  No leukocytosis.  No anemia.  CMP showed small anion gap to 21.  Likely because of dehydration ketosis.  Patient's glucose was intermittently up to 192.  After 2 L of fluid glucose improved to 108.  Discussed this with the patient.  Denies any history of.  Explained to the patient that would like to get a urine sample as well to assess for ketones.  He states he does not want to wait around to produce a urine at this point  time.  He understands the risk of no further workup process for significant ketones but because ordered for either dehydration versus euglycemic DKA which I think is unlikely no history of type 1 diabetes as well as signs and symptoms.  Patient states he understands and effectively left AMA.  Eloped before I could give him the discharge paperwork.  I did call in Zofran  for him.  I did encourage him to follow-up with his primary care physician before he left for repeat labs.  Interventions: 2 L NS,   EKG Interpreted by Me: sinus    Cardiac Tele Interpreted by Me: sinus     Social Determinant of Health: occasional marijuana    Disposition and Follow Up: PCP      Final diagnoses:  Nausea and vomiting in adult  Flu    ED Discharge Orders          Ordered    ondansetron  (ZOFRAN ) 4 MG tablet  Every 6 hours        05/13/24 1443               Simon Lavonia SAILOR, MD 05/13/24 1446  "

## 2024-05-13 NOTE — Telephone Encounter (Signed)
 FYI Only or Action Required?: FYI only for provider: ED advised.  Patient was last seen in primary care on 05/11/2024 by Job Lukes, PA.  Called Nurse Triage reporting Vomiting.  Symptoms began today.  Interventions attempted: Nothing.  Symptoms are: rapidly worsening.  Triage Disposition: Go to ED Now (overriding See HCP Within 4 Hours (Or PCP Triage))  Patient/caregiver understands and will follow disposition?: Unsure  Reason for Disposition  [1] Constant abdominal pain AND [2] present > 2 hours  Answer Assessment - Initial Assessment Questions Pt woke up with vomiting q 10 minutes x 4-5 times. EMS is on scene and reports his HR is 87. SPO2 is 99% blood pressure 120/70. Pt reports all over abdominal pain, pt unable to rate or specify location. Pt feels hot and EMS states their thermometer would not read orally or axillary.   Pt was recently seen for flu B and was vomiting at that time, but girlfriend states the vomiting has become more severe/violent/constant since the patient woke up. Pt also reports several black stools over the past several days. Pts girlfriend states that he feels hot to the touch.   This RN advised ED d/t unknown fever, severity of vomiting and abdominal pain along with potential hematochezia. Girlfriend and pt will discuss and call back if pt refuses ED.   1. VOMITING SEVERITY: How many times have you vomited in the past 24 hours?      4-5 times, q 10 minutes 2. ONSET: When did the vomiting begin?      This morning 4. ABDOMEN PAIN: Are your having any abdomen pain? If Yes : How bad is it and what does it feel like? (e.g., crampy, dull, intermittent, constant)      See above, states hurts all over but cannot rate/qualify or provide specific location 5. DIARRHEA: Is there any diarrhea? If Yes, ask: How many times today?      Denies, but states recent BMs have been black and green.  Protocols used: Vomiting-A-AH

## 2024-05-13 NOTE — ED Notes (Signed)
 Pt given water and warm blankets. States he's feeling slightly better with no urgency to vomit like earlier.

## 2024-05-13 NOTE — ED Notes (Signed)
 Pt ambulatory to bathroom with no assistance. Attempting to provide urine sample

## 2024-05-13 NOTE — ED Notes (Signed)
Pt ambulatory to bathroom with no assistance needed

## 2024-05-13 NOTE — ED Triage Notes (Signed)
 Pt caox4 c/o N/V, fever/chills x5 days, dx Flu B Monday, zpack rx with prednisone  for asthma. N/V worse over the past couple days with constant dry heaving.

## 2024-05-21 ENCOUNTER — Ambulatory Visit: Admitting: Family Medicine

## 2024-05-21 ENCOUNTER — Encounter: Payer: Self-pay | Admitting: Family Medicine

## 2024-05-21 VITALS — BP 100/64 | HR 65 | Temp 98.0°F | Ht 67.0 in | Wt 134.0 lb

## 2024-05-21 DIAGNOSIS — R1111 Vomiting without nausea: Secondary | ICD-10-CM

## 2024-05-21 DIAGNOSIS — R195 Other fecal abnormalities: Secondary | ICD-10-CM

## 2024-05-21 DIAGNOSIS — R739 Hyperglycemia, unspecified: Secondary | ICD-10-CM

## 2024-05-21 DIAGNOSIS — Z202 Contact with and (suspected) exposure to infections with a predominantly sexual mode of transmission: Secondary | ICD-10-CM

## 2024-05-21 LAB — CBC
HCT: 41.1 % (ref 39.0–52.0)
Hemoglobin: 14.3 g/dL (ref 13.0–17.0)
MCHC: 34.9 g/dL (ref 30.0–36.0)
MCV: 88.2 fl (ref 78.0–100.0)
Platelets: 421 10*3/uL — ABNORMAL HIGH (ref 150.0–400.0)
RBC: 4.65 Mil/uL (ref 4.22–5.81)
RDW: 12.6 % (ref 11.5–15.5)
WBC: 7.3 10*3/uL (ref 4.0–10.5)

## 2024-05-21 LAB — COMPREHENSIVE METABOLIC PANEL WITH GFR
ALT: 19 U/L (ref 3–53)
AST: 15 U/L (ref 5–37)
Albumin: 4.9 g/dL (ref 3.5–5.2)
Alkaline Phosphatase: 64 U/L (ref 39–117)
BUN: 9 mg/dL (ref 6–23)
CO2: 28 meq/L (ref 19–32)
Calcium: 9.4 mg/dL (ref 8.4–10.5)
Chloride: 102 meq/L (ref 96–112)
Creatinine, Ser: 0.7 mg/dL (ref 0.40–1.50)
GFR: 128.29 mL/min
Glucose, Bld: 88 mg/dL (ref 70–99)
Potassium: 3.8 meq/L (ref 3.5–5.1)
Sodium: 140 meq/L (ref 135–145)
Total Bilirubin: 0.5 mg/dL (ref 0.2–1.2)
Total Protein: 7.7 g/dL (ref 6.0–8.3)

## 2024-05-21 LAB — HEMOGLOBIN A1C: Hgb A1c MFr Bld: 5.4 % (ref 4.6–6.5)

## 2024-05-21 NOTE — Assessment & Plan Note (Signed)
 This has been a ongoing issue for many years.  Symptoms are not controlled.  We referred him to GI last year but he never heard back from them.  Will place another referral today.

## 2024-05-21 NOTE — Patient Instructions (Addendum)
 It was very nice to see you today!  VISIT SUMMARY: During your visit, we discussed your recent emergency department visit for persistent vomiting and ongoing stomach issues. We also addressed your concerns about possible celiac disease, Mycoplasma genitalium, and elevated blood sugar levels.  YOUR PLAN: CHRONIC GASTROINTESTINAL SYMPTOMS: You have been experiencing persistent stomach pain and vomiting, with recent changes in stool color. -You have been referred to a gastroenterologist for further evaluation. Please contact the specialist using the information provided. - Call gastroenterology [phone] to schedule an appointment  SCREENING FOR CELIAC DISEASE: Due to your chronic gastrointestinal symptoms, we need to screen for celiac disease. -A blood test for celiac disease antibodies has been ordered.  SCREENING FOR SEXUALLY TRANSMITTED INFECTIONS: Your partner tested positive for Mycoplasma genitalium -A urine specimen has been ordered for STI screening, including Mycoplasma genitalium, gonorrhea, chlamydia, and trichomonas.  SCREENING FOR DIABETES MELLITUS: Your recent elevated blood glucose levels and family history of diabetes necessitate screening for diabetes mellitus. -A blood test for A1c has been ordered.    Return if symptoms worsen or fail to improve.   Take care, Dr Kennyth  PLEASE NOTE:  If you had any lab tests, please let us  know if you have not heard back within a few days. You may see your results on mychart before we have a chance to review them but we will give you a call once they are reviewed by us .   If we ordered any referrals today, please let us  know if you have not heard from their office within the next week.   If you had any urgent prescriptions sent in today, please check with the pharmacy within an hour of our visit to make sure the prescription was transmitted appropriately.   Please try these tips to maintain a healthy lifestyle:  Eat at least  3 REAL meals and 1-2 snacks per day.  Aim for no more than 5 hours between eating.  If you eat breakfast, please do so within one hour of getting up.   Each meal should contain half fruits/vegetables, one quarter protein, and one quarter carbs (no bigger than a computer mouse)  Cut down on sweet beverages. This includes juice, soda, and sweet tea.   Drink at least 1 glass of water with each meal and aim for at least 8 glasses per day  Exercise at least 150 minutes every week.

## 2024-05-21 NOTE — Progress Notes (Signed)
 "  Kevin Zimmerman is a 24 y.o. male who presents today for an office visit.  Assessment/Plan:  New/Acute Problems: Vomiting  Symptoms have resolved since being in the emergency room.  He is now back to baseline.  Does note that he gets episodes about once a year of intractable vomiting.  We did offer prescription for Zofran  however he declined.  Will place referral to GI as below.  STI Exposure  Patient reports that his girlfriend was recently tested positive for mycoplasma genitalium.  He is not currently having any symptoms concerning for STI.  Will check urine cytology for gonorrhea, chlamydia, trichomonas, as well as mycoplasma genitalium.  Hyperglycemia Instantly found to have high glucose in the ED which is likely due to his intractable nausea and vomiting though we will check A1c today to rule out diabetes.  Chronic Problems Addressed Today: Loose stools This has been a ongoing issue for many years.  Symptoms are not controlled.  We referred him to GI last year but he never heard back from them.  Will place another referral today.     Subjective:  HPI:  See assessment / plan for status of chronic conditions.  Patient is here for ED follow-up.  He went to the emergency department a week ago on 05/13/2024 with 4 to 5 days of persistent vomiting.  Was not having any hematemesis.  No hematochezia fevers or chills.  No history of cyclical vomiting.  In the ED had workup including labs which were notable for mildly decreased bicarb and increased anion gap.  He was given Zofran  and Haldol  for intractable vomiting and symptoms resolved.  He subsequently left AMA.  Discussed the use of AI scribe software for clinical note transcription with the patient, who gave verbal consent to proceed.  History of Present Illness Kevin Zimmerman is a 24 year old male who presents for follow-up after an emergency department visit for persistent vomiting.  He visited the emergency department on May 13, 2024, due to four to five days of persistent vomiting without hematemesis, hematochezia, fevers, or chills. In the emergency department, he had mildly decreased bicarbonate and an increased anion gap. He was treated with Zofran  and Haldol , which resolved his symptoms, and he left against medical advice. He reports that he gets sick like that about once a year, and notes that it was 'hours of just constant' vomiting. He has not experienced similar episodes since the treatment.  He reports ongoing stomach issues and has previously consulted a nutritionist, which did not significantly alleviate his symptoms despite following a recommended diet. His father suggested the possibility of celiac disease, although he is unsure as he has not noticed changes when avoiding bread. He has experienced stomach pain for a long time, with recent changes in stool color to 'weird shades of green.'  He seeks testing for Mycoplasma genitalium after his girlfriend tested positive for it. He denies any penile discharge or drip.  During a recent hospital visit, his blood sugar levels were noted to be high, with a glucose level of 192 mg/dL, which improved after treatment. He has no known history of diabetes but mentions a family history of the condition.         Objective:  Physical Exam: BP 100/64   Pulse 65   Temp 98 F (36.7 C) (Temporal)   Ht 5' 7 (1.702 m)   Wt 134 lb (60.8 kg)   SpO2 97%   BMI 20.99 kg/m   Gen: No  acute distress, resting comfortably CV: Regular rate and rhythm with no murmurs appreciated Pulm: Normal work of breathing, clear to auscultation bilaterally with no crackles, wheezes, or rhonchi Neuro: Grossly normal, moves all extremities Psych: Normal affect and thought content      Eliyah Bazzi M. Kennyth, MD 05/21/2024 1:20 PM  "
